# Patient Record
Sex: Female | Born: 1975
Health system: Southern US, Community
[De-identification: ages and names within clinical notes are randomized; demographics above are authoritative.]

## PROBLEM LIST (undated history)

## (undated) DIAGNOSIS — R112 Nausea with vomiting, unspecified: Secondary | ICD-10-CM

## (undated) DIAGNOSIS — D649 Anemia, unspecified: Secondary | ICD-10-CM

## (undated) DIAGNOSIS — D219 Benign neoplasm of connective and other soft tissue, unspecified: Secondary | ICD-10-CM

## (undated) DIAGNOSIS — Z9889 Other specified postprocedural states: Secondary | ICD-10-CM

## (undated) HISTORY — DX: Benign neoplasm of connective and other soft tissue, unspecified: D21.9

## (undated) HISTORY — PX: EYE SURGERY: SHX253

## (undated) HISTORY — DX: Anemia, unspecified: D64.9

## (undated) HISTORY — PX: IRRIGATION AND DEBRIDEMENT SEBACEOUS CYST: SHX5255

## (undated) HISTORY — PX: UTERINE FIBROID SURGERY: SHX826

---

## 1998-04-26 ENCOUNTER — Encounter: Payer: Self-pay | Admitting: Emergency Medicine

## 1998-04-26 ENCOUNTER — Emergency Department (HOSPITAL_COMMUNITY): Admission: EM | Admit: 1998-04-26 | Discharge: 1998-04-26 | Payer: Self-pay | Admitting: *Deleted

## 1999-01-04 ENCOUNTER — Emergency Department (HOSPITAL_COMMUNITY): Admission: EM | Admit: 1999-01-04 | Discharge: 1999-01-04 | Payer: Self-pay | Admitting: Emergency Medicine

## 1999-01-04 ENCOUNTER — Encounter: Payer: Self-pay | Admitting: Emergency Medicine

## 1999-02-10 ENCOUNTER — Encounter: Admission: RE | Admit: 1999-02-10 | Discharge: 1999-02-10 | Payer: Self-pay | Admitting: Family Medicine

## 1999-04-03 ENCOUNTER — Encounter: Admission: RE | Admit: 1999-04-03 | Discharge: 1999-04-03 | Payer: Self-pay | Admitting: Family Medicine

## 1999-05-13 ENCOUNTER — Encounter: Admission: RE | Admit: 1999-05-13 | Discharge: 1999-05-13 | Payer: Self-pay | Admitting: Family Medicine

## 2007-03-25 ENCOUNTER — Inpatient Hospital Stay (HOSPITAL_COMMUNITY): Admission: AD | Admit: 2007-03-25 | Discharge: 2007-03-27 | Payer: Self-pay | Admitting: Obstetrics and Gynecology

## 2007-07-03 ENCOUNTER — Inpatient Hospital Stay (HOSPITAL_COMMUNITY): Admission: AD | Admit: 2007-07-03 | Discharge: 2007-07-06 | Payer: Self-pay | Admitting: Obstetrics and Gynecology

## 2008-05-16 ENCOUNTER — Other Ambulatory Visit: Admission: RE | Admit: 2008-05-16 | Discharge: 2008-05-16 | Payer: Self-pay | Admitting: Family Medicine

## 2009-07-29 ENCOUNTER — Inpatient Hospital Stay (HOSPITAL_COMMUNITY): Admission: AD | Admit: 2009-07-29 | Discharge: 2009-08-02 | Payer: Self-pay | Admitting: Obstetrics and Gynecology

## 2009-07-30 ENCOUNTER — Encounter (INDEPENDENT_AMBULATORY_CARE_PROVIDER_SITE_OTHER): Payer: Self-pay | Admitting: Obstetrics and Gynecology

## 2010-11-18 LAB — CBC
HCT: 28.8 % — ABNORMAL LOW (ref 36.0–46.0)
HCT: 39.1 % (ref 36.0–46.0)
Hemoglobin: 12.6 g/dL (ref 12.0–15.0)
Hemoglobin: 9.4 g/dL — ABNORMAL LOW (ref 12.0–15.0)
MCHC: 32.3 g/dL (ref 30.0–36.0)
MCHC: 32.7 g/dL (ref 30.0–36.0)
MCV: 84.4 fL (ref 78.0–100.0)
MCV: 85.2 fL (ref 78.0–100.0)
Platelets: 175 10*3/uL (ref 150–400)
RBC: 3.38 MIL/uL — ABNORMAL LOW (ref 3.87–5.11)
RBC: 4.63 MIL/uL (ref 3.87–5.11)
RDW: 14.6 % (ref 11.5–15.5)
RDW: 14.8 % (ref 11.5–15.5)
WBC: 12 10*3/uL — ABNORMAL HIGH (ref 4.0–10.5)
WBC: 15.8 10*3/uL — ABNORMAL HIGH (ref 4.0–10.5)

## 2010-11-18 LAB — RPR: RPR Ser Ql: NONREACTIVE

## 2010-12-30 NOTE — Discharge Summary (Signed)
[name], [name]               ACCOUNT NO.:  [id number]   MEDICAL RECORD NO.:  [id number]          PATIENT TYPE:  INP   LOCATION:  9156                          [facility]ILITY:  [facility]   PHYSICIAN:  Hal Morales, M.D.DATE OF BIRTH:  02-28-1976   DATE OF ADMISSION:  03/25/2007  DATE OF DISCHARGE:  03/27/2007                               DISCHARGE SUMMARY   HISTORY OF PRESENT ILLNESS:  Patient is a 35 year old gravida 1, para 0  admitted at 27 weeks and 0 days gestation with sudden onset of  periumbilical pain on March 24, 2007.  Patient without fever, nausea,  vomiting, constipation, diarrhea but some decreased appetite.   Physical examination on admission revealed gravid abdomen with marked  tenderness at the umbilicus and positive bowel sounds.  Patient with  uterine irritability and uterine contractions on admission which  resolved with terbutaline.  Patient's initial labs on admission revealed  a white count of 13.9, otherwise negative CBC.  Due to the patient's  presentation at the time of admission, general surgery consult was  obtained.  At that time, it was felt that the patient had incarcerated  umbilical hernia and was scheduled for umbilical hernia repair.  Patient  underwent exploratory laparotomy on March 26, 2007, at which time no  umbilical hernia was noted, however, a 6 cm uterine fibroid was noted to  be present.  Patient tolerated the procedure without difficulty, without  significant blood loss.  On the day of discharge, patient was  ambulating, voiding and tolerating p.o. liquids and solids without  difficulty.  Abdominal incision clean, dry and intact with staples in  place.  No redness or drainage noted.  Positive bowel sounds noted and  patient with flatus as well as bowel movement prior to discharge.   During the patient's postoperative course, some uterine contractions  were noted, however, they resolved with one dose of Motrin 600 mg.  At  the time of  discharge, patient denies uterine contractions as well as  leakage of fluid, bleeding and reports that her fetus is moving  normally.  Fetal heart rate tracing has been reassuring throughout the  patient's admission.  It was felt that the patient was stable and was  discharged to home.   DISCHARGE MEDICATIONS:  1. Tylox one to two tablets q.4-6h. p.r.n. severe pain.  2. Tylenol one to two tablets q.4-6h. p.r.n. mild pain.  3. Prenatal vitamins.   DISCHARGE INSTRUCTIONS:  Patient will return to the office on Thursday,  March 31, 2007, for staple removal and postoperative follow-up.  Patient will also return to the office to schedule for return OB visit  on April 13, 2007.  Patient was instructed if she has fever, redness or  drainage from her incision, then she should notify the office  immediately as well as any nausea, vomiting or severe abdominal pain.  Wound care was also discussed with the patient.   DIAGNOSES:  1. Intrauterine pregnancy at 27-4/7 weeks.  2. Uterine fibroid.  3. Status post exploratory laparotomy.      Rhona Leavens, CNM      Erie Noe  Durenda Hurt, M.D.  Electronically Signed    NOS/MEDQ  D:  03/27/2007  T:  03/28/2007  Job:  [id number]

## 2010-12-30 NOTE — H&P (Signed)
[name], [name]               ACCOUNT NO.:  [id number]   MEDICAL RECORD NO.:  [id number]          PATIENT TYPE:  INP   LOCATION:  9128                          [facility]  [facility]   PHYSICIAN:  Naima A. Dillard, M.D. DATE OF BIRTH:  16-Mar-1976   DATE OF ADMISSION:  07/03/2007  DATE OF DISCHARGE:                              HISTORY & PHYSICAL   This is a 35 year old gravida 1, para 0 at 41-4/7 weeks who presents  with regular contractions for several hours.  She denies leaking or  bleeding and reports positive fetal movement.  Pregnancy has been  followed by the nurse midwife service and remarkable for  1. History of fibroids.  2. Group B strep negative.   ALLERGIES:  None.   OBSTETRICAL HISTORY:  The patient is primigravida.   MEDICAL HISTORY:  Remarkable for  1. Childhood varicella.  2. Past history of UTI.   SURGICAL HISTORY:  1. Remarkable for removal of back cyst.  2. Exploratory laparotomy during this pregnancy.   FAMILY HISTORY:  Remarkable for grandmother with hypertension and  uterine cancer.  Father with throat cancer.  Family history of  depression.   GENETIC HISTORY:  Negative.   SOCIAL HISTORY:  The patient is married to [employer], who is  involved and supportive.  She is of the Islam faith.  She denies any  alcohol, tobacco or drug use.   PRENATAL LABS:  Hemoglobin 13.1, platelets 221.  Blood type A+, antibody  screen negative, sickle cell negative, RPR nonreactive, rubella immune.  Hepatitis negative, HIV negative.  Cystic fibrosis declined.   HISTORY OF CURRENT PREGNANCY:  The patient entered care at [redacted] weeks  gestation.  She declined cystic fibrosis and first trimester screen and  quad screen.  Pap was normal.  She had an ultrasound at 18 weeks that  was normal.  She had some abdominal pain with an abdominal mass at 27  weeks.  It was initially thought to be an umbilical hernia.  She was  taken to surgery for suspected incarcerated hernia and it  was found to  be a fibroid.  She healed well from that.  She had a normal Glucola and  she was group B strep negative at term.   OBJECTIVE:  VITAL SIGNS:  Stable, afebrile.  HEENT:  Within normal limits.  NECK:  Thyroid normal, not enlarged.  CHEST:  Clear to auscultation.  HEART:  Regular rate and rhythm.  ABDOMEN:  Gravid, vertex [place], [place] shows reactive fetal heart rate  with contractions every 3 minutes.  Cervix is 5 cm, 90% effaced, -1 to -  2 station with vertex presentation.  EXTREMITIES:  Within normal limits.   ASSESSMENT:  1. Intrauterine pregnancy at 41-4/7 weeks.  2. Active labor.   PLAN:  1. Admit to birthing suites per Dr. Normand Sloop.  2. Routine CNM orders.  3. Desires noninterventive birth.      Marie L. Williams, C.N.M.      Naima A. Normand Sloop, M.D.  Electronically Signed    MLW/MEDQ  D:  07/03/2007  T:  07/04/2007  Job:  [id number]

## 2010-12-30 NOTE — H&P (Signed)
[name], [name]               ACCOUNT NO.:  [id number]   MEDICAL RECORD NO.:  [id number]          PATIENT TYPE:  INP   LOCATION:  9156                          [facility]   PHYSICIAN:  Hal Morales, M.D.DATE OF BIRTH:  1976-04-25   DATE OF ADMISSION:  03/25/2007  DATE OF DISCHARGE:                              HISTORY & PHYSICAL   HISTORY OF PRESENT ILLNESS:  The patient is a 35 year old multi-ethnic  married female, gravida 1, para 0, who has been followed at [facility] for this pregnancy.  Her last menstrual period began on  September 13, 2006, giving her an estimated date of confinement of  June 22, 2007.  This was confirmed by an 18-week ultrasound and she  is currently at 39 weeks' gestation.  The patient presented to [facility] on March 25, 2007, for a work-in visit complaining of a  24-hour history of abdominal pain radiating under the umbilicus.  The  area is exquisitely tender to touch.  The area of pain is sometimes  directly over the umbilicus and sometimes radially it appears as much as  5 cm circumferentially around the umbilicus.  The pain is intermittent.  The patient has also noticed a lump in the umbilicus which is  sometimes present but then at other times has resolved with no  protrusion of the umbilicus.  She specifically denied nausea or  vomiting, constipation or diarrhea.  She has been able to continue  eating but has noticed some slight decrease in her appetite.  She denies  any fever or chills.  She has never had similar pain in the past.  She  continues to note regular fetal movement.  At the time of her evaluation  at [facility] she underwent a fetal fibronectin test, which was  subsequently noted as negative.  She also had cultures for gonorrhea,  chlamydia and group B strep, all of which are pending.  Her cervix was  long and closed.  She was sent to maternity admissions for further  evaluation.  On  admission to maternity admissions she continued to  complain of umbilical pain and was noted on the monitor to have some  uterine irritability and contractions.  She was given a single dose of  terbutaline, which allowed the contractions to resolve; however, she did  not have resolution of her pain and the pain subsequently escalated  again to a 7/10.   PAST OBSTETRICAL HISTORY:  This is the patient's first pregnancy.   PAST GYNECOLOGIC HISTORY:  Regular menses every 28-29 days, lasting 5-6  days, with no significant cramping or heavy menses.  Last menstrual  period was September 13, 2006.  The patient has never had abnormalities on  her routine gynecologic evaluations.   MEDICAL HISTORY:  The patient specifically denies any hypertension or  diabetes.  She did start a hepatitis series but did not have any further  injections of the hepatitis B prophylactic injection after the first  injection.  The remainder of her vaccination history is that her MMR is  current and she had chicken pox  as a child.  The patient has had  cystitis in the past.   SURGICAL HISTORY:  The patient had a cystic mass removed from her back  at age 62, thought to be a probable lipoma.   FAMILY HISTORY:  Positive for chronic hypertension in paternal  grandmother, uterine cancer in paternal grandmother, throat cancer in  her father.  There is also a history of domestic violence in the family  on the maternal side.  There is a history of alcoholism and drug use in  the family history.   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  She specifically denies alcohol or drug use.  She is married to  [employer].  They are of the Islamic faith.   REVIEW OF SYSTEMS:  Negative except as mentioned above.  Specifically,  the patient denies any dysuria though she has noticed a pulling  sensation at the umbilicus when she has urinated.   PHYSICAL EXAMINATION:  The patient is  a gravid multi-ethnic female in no  acute distress but mild abdominal distress on palpation.  The temperature is 97.9, pulse 83, respirations 20, blood pressure 106-  110/61-80.  LUNGS:  Clear.  HEART:  Regular rate and rhythm.  ABDOMEN:  Gravid with marked tenderness at the umbilicus.  The area  surrounding the umbilicus for approximately 5 cm is likewise tender and  a full examination is quite difficult.  Bowel sounds are normal.  EXTREMITIES:  No clubbing, cyanosis, or edema.  PELVIC:  Done at [facility] on the same day of admission  showing external genitalia within normal limits.  The vagina was rugous.  The cervix was without gross lesions.  The cervix was long and closed.  The fetal heart rate evaluation showed a baseline of 140 beats per  minute with good beat-to-beat variability and good reactivity, no  decelerations.  Contractions had originally been approximately every 2  minutes on admission, lasting about 20 seconds and mild intensity.  These decreased significantly with a single dose of terbutaline.   Laboratory studies showed a hemoglobin of 12, white blood cell count of  13.9, 87% neutrophils, 7% lymphocytes.   IMPRESSION:  1. Probable incarcerated hernia but without significant      gastrointestinal symptomatology.  2. Limited opportunity for abdominal examination.   DISPOSITION:  1. A discussion was held with Bertha Stakes, M.D., of radiology,      who felt that the ability to determine incarcerated hernia was      limited by whether or not peristalsis was occurring.  2. A general surgical consultation was requested.  A conversation with      Dr. Ezzard Standing reveals that his decision for exploration would be based      upon his examination, not upon radiologic studies, and therefore it      probably was not warranted to expose the patient to any significant      radiation and not warranted to proceed with other radiologic      studies.  His plan is  to see the patient later this evening and      make a decision about the recommendation for surgical intervention.  3. The patient will have analgesia as needed for comfort.  4. Since the fetal heart rate is quite reassuring it will be monitored      intermittently, but continuous monitoring of contraction activity      will occur with reevaluation if the  patient has more than 8      contractions per hour.      Hal Morales, M.D.  Electronically Signed     VPH/MEDQ  D:  03/26/2007  T:  03/26/2007  Job:  [id number]

## 2010-12-30 NOTE — Consult Note (Signed)
NAMEBRIN, RUGGERIO               ACCOUNT NO.:  [id number]   MEDICAL RECORD NO.:  [id number]          PATIENT TYPE:  INP   LOCATION:  9156                          [facility]ILITY:  [facility]   PHYSICIAN:  Sandria Bales. Ezzard Standing, M.D.  DATE OF BIRTH:  1976-08-06   DATE OF CONSULTATION:  DATE OF DISCHARGE:                                 CONSULTATION   REASON FOR CONSULTATION:  Umbilical hernia.   HISTORY OF ILLNESS:  This is a 35 year old female who is a patient of  Hal Morales, M.D. who is in her first pregnancy with a due date of  June 22, 2007.  So, she is approximately 26-[redacted] weeks pregnant.  She  has had over the last 3 weeks an increasing infraumbilical mass which  has become increasingly tender.  She was admitted by Dr. Pennie Rushing this  evening for this tender mass.  She has had no prior history of peptic  ulcer disease, liver disease, gallbladder disease, pancreatic disease,  or colon disease.  She has had no prior abdominal surgery.   PAST MEDICAL HISTORY:  1. She has no allergies.  2. She is on no medications, except for her perinatal vitamins,      etcetera.   REVIEW OF SYSTEMS:  Negative for significant pulmonary, cardiac,  gastrointestinal, or urologic problems.   She works as a Child psychotherapist.  Her husband is in the room with her.   PHYSICAL EXAMINATION:  VITAL SIGNS:  Her pulse is 68.  Her blood  pressure is 98/57.  Her temperature is 97.5.  Her fetal monitor shows a  fetal rate of 135-140.  HEENT:  Unremarkable.  NECK:  Supple.  I feel no mass, no thyromegaly.  LUNGS:  Clear to auscultation.  HEART:  Regular rate and rhythm without murmur or rub.  BREASTS:  I did not examine her breasts.  ABDOMEN:  She has a uterus which is probably about 3-cm above her  umbilicus consistent with her current pregnancy.  She also has about a 4-  to-5-cm tender mass immediately below her umbilicus.  I am not able to  reduce this mass.  And, she is very tender to palpation.  It feels  somewhat unusual for an umbilical hernia.  EXTREMITIES:  Show good strength in all 4 extremities.  NEUROLOGIC:  Grossly intact.   IMPRESSION:  1. She has a possible incarcerated umbilical/ventral hernia.  It does      not feel like a typical umbilical hernia and I have never seen an      incarcerated umbilical hernia this far along in a pregnancy.  As      tender as she is,  I do not see how this can wait till the end of      her pregnancy and I think she would be best served by going ahead      and having this explored at this time.  I discussed with her and her husband the risks of surgery which include  bleeding, infection, recurrence of the hernia, some type of bowel  injury.  I doubt  this is related to  the uterus or pregnancy but there is a  chance.  There is also a chance of inducing labor.  I  think they understand both that risk and benefit.  I also spoke to Dr.  Pennie Rushing over this.  Dr. Pennie Rushing expressed interest at being in the  operating room during the surgery.  I think it would be a good idea.  1. Intrauterine pregnancy, approximately 27 weeks.      Sandria Bales. Ezzard Standing, M.D.  Electronically Signed     DHN/MEDQ  D:  03/25/2007  T:  03/25/2007  Job:  [id number]   cc:   Hal Morales, M.D.  Fax: [phone]

## 2010-12-30 NOTE — Op Note (Signed)
[name], [name]               ACCOUNT NO.:  [id number]   MEDICAL RECORD NO.:  [id number]          PATIENT TYPE:  INP   LOCATION:  9156                          [facility]   PHYSICIAN:  Sandria Bales. Ezzard Standing, M.D.  DATE OF BIRTH:  Feb 05, 1976   DATE OF PROCEDURE:  03/26/2007  DATE OF DISCHARGE:                               OPERATIVE REPORT   PREOPERATIVE DIAGNOSIS:  Incarcerated umbilical hernia.   POSTOPERATIVE DIAGNOSIS:  A 6-cm uterine fibroid on top of [redacted] week  pregnant uterus, no evidence of fascial defect or umbilical hernia.   PROCEDURE:  Abdominal exploration.   SURGEON:  Sandria Bales. Ezzard Standing, M.D.   FIRST ASSISTANT:  Vanessa P. Pennie Rushing, M.D.   ANESTHESIA:  Spinal with 20 cc of 0.25% Marcaine.   COMPLICATIONS:  None.   INDICATIONS FOR PROCEDURE:  The patient is a 35 year old female who is  approximately [redacted] weeks pregnant who presented with an increasing tender  infraumbilical mass over the last 2-3 weeks.  She was admitted yesterday  by Dr. Pennie Rushing to [facility] and on examination she has a broad  based mass immediately below her umbilicus.  She is so tender it is very  hard to get a good idea of this mass.  My concern was that she had a  possible incarcerated umbilical hernia.  We discussed the options of  continued observation for the duration of her pregnancy versus exploring  this area and she agreed to explore this area.   The indications and potential complications of surgery were explained to  the patient.  Potential complications include but are not limited to  bleeding, infection, bowel injury, possibility of some kind of resection  and that this could be something other than an umbilical hernia.   OPERATIVE NOTE:  The patient had a spinal anesthetic placed by Dr. Dartha Lodge.  She was given 1 gram of Ancef at initiation of procedure.  Abdomen was prepped with Betadine solution and sterilely draped.  She  had a Foley catheter in place.  A time out  was held identifying patient  and procedure.   Dr. Pennie Rushing scrubbed in during the procedure.  I made a vertical  incision around the umbilicus and cut down through the skin but this  lady is actually much thinner than I actually could appreciate with her  pregnancy and actually got into the fascia and saw no fascial defect but  I could feel this mass which actually was somewhat mobile immediately  under the fascia.  I made an incision in the fascia, approximately 5 cm  in length, and what she had was a uterine fibroid which was protruding  from the anterior surface of her uterus.   Dr. Pennie Rushing was there, discussion actually carried out with the patient  since she was alert about whether this ought to be resected or left  alone.  The decision with Dr. Pennie Rushing and patient was to leave this  alone.  There was no bowel, no intestines, no omentum stuck to this at  all.   I then closed the abdomen with two running #1  PDS sutures and stapled  the skin.  I then infiltrated the fascia with 0.25% Marcaine, about 20  cc.   The patient tolerated the procedure well.  Will be kept overnight.  If  she does well, will be discharged home tomorrow.  I will leave her  discharge plans and follow up with Dr. Pennie Rushing regarding diet, staple  removal, and her appointment to follow up with me, but I will try to see  her tomorrow on rounds.      Sandria Bales. Ezzard Standing, M.D.  Electronically Signed     DHN/MEDQ  D:  03/26/2007  T:  03/27/2007  Job:  [id number]   cc:   Hal Morales, M.D.  Fax: [phone]

## 2011-05-26 LAB — CBC
HCT: 28.3 — ABNORMAL LOW
MCHC: 33
MCV: 78.8
Platelets: 160
RBC: 4.88
RDW: 16.2 — ABNORMAL HIGH
RDW: 16.7 — ABNORMAL HIGH

## 2011-05-26 LAB — RPR: RPR Ser Ql: NONREACTIVE

## 2011-06-01 LAB — DIFFERENTIAL
Basophils Absolute: 0
Basophils Relative: 0
Eosinophils Absolute: 0.1
Eosinophils Relative: 1
Lymphocytes Relative: 7 — ABNORMAL LOW
Lymphs Abs: 1
Monocytes Absolute: 0.8 — ABNORMAL HIGH
Monocytes Relative: 6
Neutro Abs: 12.1 — ABNORMAL HIGH
Neutrophils Relative %: 87 — ABNORMAL HIGH

## 2011-06-01 LAB — CBC
Hemoglobin: 12
Platelets: 183
RDW: 14
WBC: 13.9 — ABNORMAL HIGH

## 2011-12-31 ENCOUNTER — Ambulatory Visit (INDEPENDENT_AMBULATORY_CARE_PROVIDER_SITE_OTHER): Payer: BC Managed Care – PPO | Admitting: Internal Medicine

## 2011-12-31 VITALS — BP 112/73 | HR 78 | Temp 98.9°F | Resp 16 | Ht 65.5 in | Wt 169.0 lb

## 2011-12-31 DIAGNOSIS — R05 Cough: Secondary | ICD-10-CM

## 2011-12-31 DIAGNOSIS — J029 Acute pharyngitis, unspecified: Secondary | ICD-10-CM

## 2011-12-31 DIAGNOSIS — R6883 Chills (without fever): Secondary | ICD-10-CM

## 2011-12-31 DIAGNOSIS — J40 Bronchitis, not specified as acute or chronic: Secondary | ICD-10-CM

## 2011-12-31 MED ORDER — HYDROCODONE-ACETAMINOPHEN 7.5-500 MG/15ML PO SOLN: 5.0000 mL | Freq: Four times a day (QID) | ORAL | Status: AC | PRN

## 2011-12-31 MED ORDER — AZITHROMYCIN 500 MG PO TABS: 500.0000 mg | ORAL_TABLET | Freq: Every day | ORAL | Status: AC

## 2011-12-31 NOTE — Progress Notes (Signed)
  Subjective:    Patient ID: [name], female    DOB: 09/17/1975, 36 y.o.   MRN: [id number]  HPI Has cough , chills, and st sudden on set. Her child is sick with same Sputum green.   Review of Systems     Objective:   Physical Exam Alert, NAD Throat red Ears clear Lungs coarse bs      Assessment & Plan:  Bronchitis

## 2011-12-31 NOTE — Patient Instructions (Signed)

## 2012-06-13 ENCOUNTER — Other Ambulatory Visit: Payer: Self-pay | Admitting: Obstetrics and Gynecology

## 2012-06-13 ENCOUNTER — Encounter: Payer: Self-pay | Admitting: Obstetrics and Gynecology

## 2012-06-13 ENCOUNTER — Ambulatory Visit (INDEPENDENT_AMBULATORY_CARE_PROVIDER_SITE_OTHER): Payer: BC Managed Care – PPO | Admitting: Obstetrics and Gynecology

## 2012-06-13 ENCOUNTER — Ambulatory Visit (INDEPENDENT_AMBULATORY_CARE_PROVIDER_SITE_OTHER): Payer: BC Managed Care – PPO

## 2012-06-13 ENCOUNTER — Telehealth: Payer: Self-pay | Admitting: Obstetrics and Gynecology

## 2012-06-13 VITALS — BP 102/62 | Wt 176.0 lb

## 2012-06-13 DIAGNOSIS — O200 Threatened abortion: Secondary | ICD-10-CM

## 2012-06-13 DIAGNOSIS — O209 Hemorrhage in early pregnancy, unspecified: Secondary | ICD-10-CM

## 2012-06-13 LAB — CBC WITH DIFFERENTIAL/PLATELET
Basophils Relative: 0 % (ref 0–1)
Eosinophils Absolute: 0.4 10*3/uL (ref 0.0–0.7)
Eosinophils Relative: 4 % (ref 0–5)
HCT: 38.2 % (ref 36.0–46.0)
Hemoglobin: 12.5 g/dL (ref 12.0–15.0)
MCH: 26.3 pg (ref 26.0–34.0)
MCHC: 32.7 g/dL (ref 30.0–36.0)
MCV: 80.3 fL (ref 78.0–100.0)
Monocytes Absolute: 0.9 10*3/uL (ref 0.1–1.0)
Monocytes Relative: 9 % (ref 3–12)

## 2012-06-13 NOTE — Progress Notes (Signed)
F/u u/s for 1st trimester bleeding.  BP 102/62  Wt 176 lb (79.833 kg)  LMP 04/06/2012 Pt reports she still has some spotting She declined a pelvic exam [place] sig for 2 yolk sacs and size less than dates.  Normal adnexa Threatened abortion ? Twin pregnancy Pt given bleeding precautions.  Her blood type is A pos Repeat US in 2 weeks for f/u viability

## 2012-06-13 NOTE — Telephone Encounter (Signed)
VM from pt. States LMP 04/06/12.  Had some bleeding 06/11/12.  Since 06/12/12 has been bleeding and cramping like a normal menses. Has NOB interview scheduled today.  Per DR ND,interview cancelled.  Scheduled for U/S and F/U today.

## 2012-06-14 ENCOUNTER — Other Ambulatory Visit: Payer: Self-pay | Admitting: Obstetrics and Gynecology

## 2012-06-14 ENCOUNTER — Inpatient Hospital Stay (HOSPITAL_COMMUNITY)
Admission: AD | Admit: 2012-06-14 | Discharge: 2012-06-14 | Disposition: A | Discharge status: Home / Self Care | Payer: BC Managed Care – PPO | Source: Ambulatory Visit | Attending: Obstetrics and Gynecology | Admitting: Obstetrics and Gynecology

## 2012-06-14 ENCOUNTER — Telehealth: Payer: Self-pay | Admitting: Obstetrics and Gynecology

## 2012-06-14 ENCOUNTER — Inpatient Hospital Stay (HOSPITAL_COMMUNITY): Payer: BC Managed Care – PPO

## 2012-06-14 ENCOUNTER — Encounter (HOSPITAL_COMMUNITY): Payer: Self-pay | Admitting: Family

## 2012-06-14 DIAGNOSIS — O034 Incomplete spontaneous abortion without complication: Secondary | ICD-10-CM

## 2012-06-14 DIAGNOSIS — O200 Threatened abortion: Secondary | ICD-10-CM | POA: Insufficient documentation

## 2012-06-14 LAB — CBC
MCH: 26.6 pg (ref 26.0–34.0)
MCV: 80.1 fL (ref 78.0–100.0)
Platelets: 245 10*3/uL (ref 150–400)
RBC: 4.62 MIL/uL (ref 3.87–5.11)
RDW: 15.2 % (ref 11.5–15.5)

## 2012-06-14 NOTE — Telephone Encounter (Signed)
TC from pt. States had cramping and back pain last PM.  Today used 2 pads, but within last hour felt gush and has filled a pad. Per SL, pt to MAU.

## 2012-06-14 NOTE — MAU Provider Note (Signed)
Pt is a 36yo G3P2 at [redacted]w[redacted]d by LMP Seen in office yesterday for evaluation of bleeding and US showed ? 2 yolk sacs and S<D Pt was instructed to CTO and f/u if bleeding/pain  Increased  She arrived to MAU after calling office to report increased bleeding Upon arrival, pt passed a 3cm clot ?tissue States she has little to no pain after that  [place] today revealed no IUP, no GS, endometrial debris, sm corpus luteum on L side, otherwise normal Results for orders placed during the hospital encounter of 06/14/12 (from the past 24 hour(s))  CBC     Status: Abnormal   Collection Time   06/14/12  4:35 PM      Component Value Range   WBC 10.6 (*) 4.0 - 10.5 K/uL   RBC 4.62  3.87 - 5.11 MIL/uL   Hemoglobin 12.3  12.0 - 15.0 g/dL   HCT 21.3  08.6 - 57.8 %   MCV 80.1  78.0 - 100.0 fL   MCH 26.6  26.0 - 34.0 pg   MCHC 33.2  30.0 - 36.0 g/dL   RDW 46.9  62.9 - 52.8 %   Platelets 245  150 - 400 K/uL  HCG, QUANTITATIVE, PREGNANCY     Status: Abnormal   Collection Time   06/14/12  4:35 PM      Component Value Range   hCG, Beta Chain, Quant, S 10846 (*) <5 mIU/mL     ROS - neg   PE A&O no distress Heart RRR Lungs CTAB abd soft, NT,  Pelvic declined per pt, scant bleeding on pad Ext - WNL  A: SAB in progress  CBC stable VSS  P: rv'd SAB, bleeding precautions Pt to f/u office 1-2wks for repeat quant D/w Dr Pennie Rushing

## 2012-06-15 ENCOUNTER — Other Ambulatory Visit: Payer: Self-pay | Admitting: Obstetrics and Gynecology

## 2012-06-15 ENCOUNTER — Telehealth: Payer: Self-pay | Admitting: Obstetrics and Gynecology

## 2012-06-15 DIAGNOSIS — O039 Complete or unspecified spontaneous abortion without complication: Secondary | ICD-10-CM

## 2012-06-15 LAB — US OB TRANSVAGINAL

## 2012-06-15 LAB — US OB COMP LESS 14 WKS

## 2012-06-15 NOTE — Telephone Encounter (Signed)
Message copied by Mason Jim on Wed Jun 15, 2012 10:41 AM ------      Message from: Malissa Hippo.      Created: Tue Jun 14, 2012 10:41 PM      Regarding: pt needs quant and f/u SAB        Pt 9wks by LMP, yolk sac only on [place] yesterday      Today in MAU no IUP for gestational sac      Bleeding stable      Quant 10, 000            Pt needs repeat quant in 2wks      And office f/u 4-6 wks (w provider of her choice)            Thanks!      SL

## 2012-06-15 NOTE — Telephone Encounter (Signed)
Tc TO pt.  Per SL scheduled repeat [facility] 06/28/12 and F/U appt with DR ND 07/19/12.  States cramping has decreased and is feeling better.  To call with any concerns.  Sympathy given.

## 2012-06-15 NOTE — Telephone Encounter (Signed)
Message copied by Mason Jim on Wed Jun 15, 2012 10:49 AM ------      Message from: Malissa Hippo.      Created: Tue Jun 14, 2012 10:41 PM      Regarding: pt needs quant and f/u SAB        Pt 9wks by LMP, yolk sac only on [place] yesterday      Today in MAU no IUP for gestational sac      Bleeding stable      Quant 10, 000            Pt needs repeat quant in 2wks      And office f/u 4-6 wks (w provider of her choice)            Thanks!      SL

## 2012-06-16 ENCOUNTER — Telehealth: Payer: Self-pay

## 2012-06-16 ENCOUNTER — Other Ambulatory Visit: Payer: BC Managed Care – PPO

## 2012-06-16 DIAGNOSIS — O209 Hemorrhage in early pregnancy, unspecified: Secondary | ICD-10-CM

## 2012-06-16 NOTE — Telephone Encounter (Signed)
Pc to [facility] per verbal path report from Dr. Frederica Kuster. Pt to have a QHCG today. If < 10,000, pt to keep fu appts sched 06/28/12 for [facility] and fu with ND on 07/19/12. If QHCG not <10,000, on call provider is to be made aware and decide POC. Lm on pt's vm to cb to sched [facility] today.

## 2012-06-16 NOTE — Telephone Encounter (Signed)
Pc from Dr. Frederica Kuster. Wanted to make VPH aware that there is no known evidence of products of conception and no villi seen from specimen. Will make VPH aware.

## 2012-06-16 NOTE — Telephone Encounter (Signed)
Tc from pt. Appt sched 06/16/12 @ 4:00p with lab only. Pt agrees.

## 2012-06-17 ENCOUNTER — Encounter: Payer: Self-pay | Admitting: Obstetrics and Gynecology

## 2012-06-17 ENCOUNTER — Ambulatory Visit (INDEPENDENT_AMBULATORY_CARE_PROVIDER_SITE_OTHER): Payer: BC Managed Care – PPO | Admitting: Obstetrics and Gynecology

## 2012-06-17 ENCOUNTER — Telehealth: Payer: Self-pay | Admitting: Obstetrics and Gynecology

## 2012-06-17 VITALS — BP 124/78 | Temp 98.9°F | Wt 176.0 lb

## 2012-06-17 DIAGNOSIS — O039 Complete or unspecified spontaneous abortion without complication: Secondary | ICD-10-CM

## 2012-06-17 NOTE — Telephone Encounter (Signed)
TC from pt.   States feels as though something is protruding from vagina. In process of SAB. Still having bleeding. +odor.  No pain, only dull burning.  Sched for eval with EP today.

## 2012-06-17 NOTE — Telephone Encounter (Signed)
QHCG=4718 on 06/16/12.

## 2012-06-17 NOTE — Progress Notes (Signed)
36 YO patient with previous QHCG (06/14/12) = 10,846 was seen for menstrual-like bleeding since 06/12/12.  A pelvic U/S on 06/13/12 showed an early twin gestation but no fetal pole.  A follow up ultrasound on 06/15/12 showed no signs of pregnancy. Patient here today after noticing something in vagina last night.  She looked due to her bleeding showing signs of increase (had diminished to a pad every 6 + hours) requiring her to need to change her pad sooner than expected.  So far today has only changed pad twice and feels lots of pelvic pressure.  O: Pelvic: EGBUS-visible ? POC at vaginal os-removed with ring forcep, vagina-moderate blood with ? POC protruding from cervical os-removed with forcep  A: Miscarriage  P; Specimens- ? POC sent to pathology      Patient states that she is emotionally accepting of the miscarriage process     To repeat QHCG in 1 week     Call with any concerns  Demetra Moya, PA-C

## 2012-06-22 ENCOUNTER — Encounter: Payer: Self-pay | Admitting: Obstetrics and Gynecology

## 2012-06-22 ENCOUNTER — Other Ambulatory Visit: Payer: BC Managed Care – PPO

## 2012-06-22 DIAGNOSIS — O030 Genital tract and pelvic infection following incomplete spontaneous abortion: Secondary | ICD-10-CM

## 2012-06-22 LAB — HCG, QUANTITATIVE, PREGNANCY: hCG, Beta Chain, Quant, S: 698.7 m[IU]/mL

## 2012-06-23 ENCOUNTER — Telehealth: Payer: Self-pay

## 2012-06-23 NOTE — Telephone Encounter (Signed)
SPOKE WITH PT REGARDING HER QHCG LEVEL AND ADVISED PT THAT SHE NEED TO DO WEEKLY BLOOD TEST UNTIL LEVELS ARE NEGATIVE. PT  IS SCHEDULED NEXT TUES.ADVISED PT TO USE CONDOMS UNTIL LEVELS ARE NORMAL . PT VOICED UNDERSTANDING.

## 2012-06-23 NOTE — Telephone Encounter (Signed)
Message copied by Winfred Leeds on Thu Jun 23, 2012  8:52 AM ------      Message from: Henreitta Leber      Created: Thu Jun 23, 2012  7:26 AM       Notify patient of [facility] level and need to repeat weekly until values are negative.  Advise to use condoms until levels return negative.  Thank you.  EP

## 2012-06-23 NOTE — Telephone Encounter (Signed)
TC TO PT REGARDING PATHOLOGY REPORT. INFORMED PT THAT THE REPORT SHOWS PRODUCT OF CONCEPTION. PT STATES THAT SHE IS STILL BLEEDING AND WANTED TO KNOW IF THAT WAS NORMAL. INFORMED PT THAT IT IS NORMAL BUT, IF SHE STARTS TO SOAK A PAD AN HOUR AND IF HER CRAMPING BECOME SEVERE, TO GIVE [place] A CALL BACK. PT VOICED UNDERSTANDING.

## 2012-06-23 NOTE — Telephone Encounter (Signed)
Message copied by Winfred Leeds on Thu Jun 23, 2012  8:48 AM ------      Message from: Henreitta Leber      Created: Thu Jun 23, 2012  7:29 AM       Notify patient that the results of her specimen were products of conception.  Thanks,  EP

## 2012-06-28 ENCOUNTER — Other Ambulatory Visit: Payer: BC Managed Care – PPO

## 2012-06-28 DIAGNOSIS — O030 Genital tract and pelvic infection following incomplete spontaneous abortion: Secondary | ICD-10-CM

## 2012-06-30 ENCOUNTER — Other Ambulatory Visit: Payer: Self-pay

## 2012-06-30 ENCOUNTER — Telehealth: Payer: Self-pay

## 2012-06-30 DIAGNOSIS — O039 Complete or unspecified spontaneous abortion without complication: Secondary | ICD-10-CM

## 2012-06-30 NOTE — Telephone Encounter (Signed)
Message copied by Winfred Leeds on Thu Jun 30, 2012  3:00 PM ------      Message from: Henreitta Leber      Created: Wed Jun 29, 2012  8:42 AM       Please notify patient of QHCG level and arrange for a repeat in 10 days.  Advise patient to use condoms until her QHCG levels are negative.  Thank you.  EP

## 2012-06-30 NOTE — Telephone Encounter (Signed)
TC TO PT REGARDING QUANT RESULTS. WENT OVER RESULTS AND SCHEDULE APPT FOR PT TO COME BACK ON 07-08-12 FOR REPEAT QUANT. ADVISED PT TO USE CONDOMS UNTIL LEVELS ARE NORMAL. PT VOICED UNDERSTANDING.

## 2012-07-08 ENCOUNTER — Other Ambulatory Visit: Payer: BC Managed Care – PPO

## 2012-07-08 DIAGNOSIS — O039 Complete or unspecified spontaneous abortion without complication: Secondary | ICD-10-CM

## 2012-07-11 ENCOUNTER — Telehealth: Payer: Self-pay

## 2012-07-11 NOTE — Telephone Encounter (Signed)
Message copied by Winfred Leeds on Mon Jul 11, 2012  1:54 PM ------      Message from: Henreitta Leber      Created: Mon Jul 11, 2012 12:13 PM       Patient will need to repeat her [facility] in 10 days thank you.  EP

## 2012-07-11 NOTE — Telephone Encounter (Signed)
Tc to pt regarding quant results. Informed pt that we need to repeat quant test in 10 days and pt states that she have an appt with ND on 07-19-12; could she do the test then? Informed pt that I will check to see if it is okay and if not ,we will schedule a day when she come for appt with ND. Pt voiced understanding.

## 2012-07-12 ENCOUNTER — Encounter: Payer: Self-pay | Admitting: Obstetrics and Gynecology

## 2012-07-19 ENCOUNTER — Encounter: Payer: Self-pay | Admitting: Obstetrics and Gynecology

## 2012-07-19 ENCOUNTER — Ambulatory Visit (INDEPENDENT_AMBULATORY_CARE_PROVIDER_SITE_OTHER): Payer: BC Managed Care – PPO | Admitting: Obstetrics and Gynecology

## 2012-07-19 VITALS — BP 110/60 | Ht 66.0 in | Wt 177.0 lb

## 2012-07-19 DIAGNOSIS — O039 Complete or unspecified spontaneous abortion without complication: Secondary | ICD-10-CM

## 2012-07-19 NOTE — Progress Notes (Signed)
Pt her to f/u SAB.  Pt still with some bleeding.   She is emotionally stable BP 110/60  Ht 5\' 6"  (1.676 m)  Wt 177 lb (80.287 kg)  BMI 28.57 kg/m2  LMP 04/06/2012 Physical Examination: General appearance - alert, well appearing, and in no distress Chest - clear to auscultation, no wheezes, rales or rhonchi, symmetric air entry Heart - normal rate and regular rhythm Abdomen - soft, nontender, nondistended, no masses or organomegaly Pelvic - normal external genitalia, vulva, vagina, cervix, uterus and adnexa.  Scant bleeding noted   Report     Comments:    FINAL DIAGNOSIS: A,B.  Products of Conception:        Degenerated chorionic villi and decidua. Daleen Bo, MD, FCAP Electronically Signed CLINICAL HISTORY: 634.90 SOURCE OF SPECIMEN: A: Products of Conception B: Products of Conception GROSS DESCRIPTION: There are two containers. A. Received in a single container of formalin labeled with the patient's name and "POC" is a 3.5 x 2.5 x 1.5 cm aggregate of multiple tan-brown, irregular, friable soft tissue fragments admixed with a moderate amount of tan-red, gelatinous blood clot.  No definitive chorionic villi or fetal tissue is grossly identified.  The specimen is submitted entirely in cassettes A1-A4.  AMK/srt  B. Received in a single container of formalin labeled with the patient's name and "POC" is a 3.2 x 2.7 x 2.2 cm aggregate of multiple tan-brown, irregular, partially friable soft tissue fragments admixed with a moderate amount of tan-red, gelatinous blood clot.  No definitive chorionic villi or fetal tissue is grossly identified.  The specimen is submitted entirely in cassettes B1-B4. AMK/srt     Resulting Agency [facility]  Result Narrative   SAB POC seen Check BHCG quant until less than 5

## 2012-07-20 LAB — HCG, QUANTITATIVE, PREGNANCY: hCG, Beta Chain, Quant, S: 8.7 m[IU]/mL

## 2012-10-01 ENCOUNTER — Other Ambulatory Visit: Payer: Self-pay

## 2013-06-22 ENCOUNTER — Other Ambulatory Visit: Payer: Self-pay

## 2013-08-09 LAB — OB RESULTS CONSOLE ABO/RH: RH Type: POSITIVE

## 2013-08-09 LAB — OB RESULTS CONSOLE ANTIBODY SCREEN: ANTIBODY SCREEN: NEGATIVE

## 2013-08-09 LAB — OB RESULTS CONSOLE HEPATITIS B SURFACE ANTIGEN: Hepatitis B Surface Ag: NEGATIVE

## 2013-08-09 LAB — OB RESULTS CONSOLE GC/CHLAMYDIA
CHLAMYDIA, DNA PROBE: NEGATIVE
GC PROBE AMP, GENITAL: NEGATIVE

## 2013-08-09 LAB — OB RESULTS CONSOLE HIV ANTIBODY (ROUTINE TESTING): HIV: NONREACTIVE

## 2013-08-09 LAB — OB RESULTS CONSOLE RPR: RPR: NONREACTIVE

## 2013-08-09 LAB — OB RESULTS CONSOLE RUBELLA ANTIBODY, IGM: RUBELLA: IMMUNE

## 2013-08-17 NOTE — L&D Delivery Note (Signed)
Operative Delivery Note At 5:20 AM a viable female was delivered via VBAC, Vacuum Assisted.  Presentation: vertex; Position: Right,, Occiput,, Anterior; Station: +3.  Verbal consent: obtained from patient.  Risks and benefits discussed in detail.  Risks include, but are not limited to the risks of anesthesia, bleeding, infection, damage to maternal tissues, fetal cephalhematoma.  There is also the risk of inability to effect vaginal delivery of the head, or shoulder dystocia that cannot be resolved by established maneuvers, leading to the need for emergency cesarean section.  Kiwi vacuum was placed without difficulty, 3 pulls with one pop off.  Delivery without complications.  APGAR: 9, 9; weight 8 lb 3.9 oz (3740 g).   Placenta status: Intact, Spontaneous.   Cord: 3 vessels with the following complications: None.  Cord pH: pending  Anesthesia: Epidural  Episiotomy: None Lacerations: Labial, Perineal Suture Repair: 3.0 vicryl Est. Blood Loss (mL): 300  Mom to postpartum.  Baby to Couplet care / Skin to Skin.  Janyth Pupa, M 03/11/2014, 7:28 AM

## 2014-01-01 ENCOUNTER — Encounter: Payer: Self-pay | Admitting: *Deleted

## 2014-02-08 LAB — OB RESULTS CONSOLE GBS: GBS: POSITIVE

## 2014-03-10 ENCOUNTER — Encounter (HOSPITAL_COMMUNITY): Payer: Self-pay

## 2014-03-10 ENCOUNTER — Encounter (HOSPITAL_COMMUNITY): Payer: BC Managed Care – PPO | Admitting: Anesthesiology

## 2014-03-10 ENCOUNTER — Inpatient Hospital Stay (HOSPITAL_COMMUNITY)
Admission: AD | Admit: 2014-03-10 | Discharge: 2014-03-13 | DRG: 775 | Disposition: A | Discharge status: Home / Self Care | Payer: BC Managed Care – PPO | Source: Ambulatory Visit | Attending: Obstetrics & Gynecology | Admitting: Obstetrics & Gynecology

## 2014-03-10 ENCOUNTER — Inpatient Hospital Stay (HOSPITAL_COMMUNITY): Payer: BC Managed Care – PPO | Admitting: Anesthesiology

## 2014-03-10 DIAGNOSIS — O9989 Other specified diseases and conditions complicating pregnancy, childbirth and the puerperium: Secondary | ICD-10-CM

## 2014-03-10 DIAGNOSIS — D649 Anemia, unspecified: Secondary | ICD-10-CM | POA: Diagnosis present

## 2014-03-10 DIAGNOSIS — O9902 Anemia complicating childbirth: Secondary | ICD-10-CM | POA: Diagnosis present

## 2014-03-10 DIAGNOSIS — O34599 Maternal care for other abnormalities of gravid uterus, unspecified trimester: Secondary | ICD-10-CM | POA: Diagnosis present

## 2014-03-10 DIAGNOSIS — O4290 Premature rupture of membranes, unspecified as to length of time between rupture and onset of labor, unspecified weeks of gestation: Secondary | ICD-10-CM | POA: Diagnosis present

## 2014-03-10 DIAGNOSIS — O09529 Supervision of elderly multigravida, unspecified trimester: Secondary | ICD-10-CM | POA: Diagnosis present

## 2014-03-10 DIAGNOSIS — IMO0002 Reserved for concepts with insufficient information to code with codable children: Secondary | ICD-10-CM | POA: Diagnosis present

## 2014-03-10 DIAGNOSIS — Z22330 Carrier of Group B streptococcus: Secondary | ICD-10-CM

## 2014-03-10 DIAGNOSIS — Z6835 Body mass index (BMI) 35.0-35.9, adult: Secondary | ICD-10-CM

## 2014-03-10 DIAGNOSIS — E669 Obesity, unspecified: Secondary | ICD-10-CM | POA: Diagnosis present

## 2014-03-10 DIAGNOSIS — Z833 Family history of diabetes mellitus: Secondary | ICD-10-CM

## 2014-03-10 DIAGNOSIS — O99214 Obesity complicating childbirth: Secondary | ICD-10-CM

## 2014-03-10 DIAGNOSIS — Z98891 History of uterine scar from previous surgery: Secondary | ICD-10-CM

## 2014-03-10 DIAGNOSIS — O34219 Maternal care for unspecified type scar from previous cesarean delivery: Secondary | ICD-10-CM | POA: Diagnosis present

## 2014-03-10 DIAGNOSIS — O3410 Maternal care for benign tumor of corpus uteri, unspecified trimester: Secondary | ICD-10-CM

## 2014-03-10 DIAGNOSIS — D259 Leiomyoma of uterus, unspecified: Secondary | ICD-10-CM | POA: Diagnosis present

## 2014-03-10 DIAGNOSIS — O99892 Other specified diseases and conditions complicating childbirth: Secondary | ICD-10-CM | POA: Diagnosis present

## 2014-03-10 HISTORY — DX: Nausea with vomiting, unspecified: R11.2

## 2014-03-10 HISTORY — DX: Other specified postprocedural states: Z98.890

## 2014-03-10 LAB — CBC
HCT: 32.9 % — ABNORMAL LOW (ref 36.0–46.0)
HEMOGLOBIN: 10.4 g/dL — AB (ref 12.0–15.0)
MCH: 21.6 pg — ABNORMAL LOW (ref 26.0–34.0)
MCHC: 31.6 g/dL (ref 30.0–36.0)
MCV: 68.4 fL — ABNORMAL LOW (ref 78.0–100.0)
Platelets: 194 10*3/uL (ref 150–400)
RBC: 4.81 MIL/uL (ref 3.87–5.11)
RDW: 17.9 % — ABNORMAL HIGH (ref 11.5–15.5)
WBC: 12.7 10*3/uL — AB (ref 4.0–10.5)

## 2014-03-10 LAB — TYPE AND SCREEN
ABO/RH(D): A POS
ANTIBODY SCREEN: NEGATIVE

## 2014-03-10 LAB — AMNISURE RUPTURE OF MEMBRANE (ROM) NOT AT ARMC: Amnisure ROM: POSITIVE

## 2014-03-10 MED ORDER — CITRIC ACID-SODIUM CITRATE 334-500 MG/5ML PO SOLN: 30.0000 mL | ORAL | Status: DC | PRN

## 2014-03-10 MED ORDER — LIDOCAINE HCL (PF) 1 % IJ SOLN
30.0000 mL | INTRAMUSCULAR | Status: DC | PRN
  Filled 2014-03-10: qty 30

## 2014-03-10 MED ORDER — PENICILLIN G POTASSIUM 5000000 UNITS IJ SOLR
2.5000 10*6.[IU] | INTRAMUSCULAR | Status: DC
  Administered 2014-03-11 (×2): 2.5 10*6.[IU] via INTRAVENOUS
  Filled 2014-03-10 (×5): qty 2.5

## 2014-03-10 MED ORDER — ACETAMINOPHEN 325 MG PO TABS: 650.0000 mg | ORAL_TABLET | ORAL | Status: DC | PRN

## 2014-03-10 MED ORDER — FENTANYL 2.5 MCG/ML BUPIVACAINE 1/10 % EPIDURAL INFUSION (WH - ANES)
14.0000 mL/h | INTRAMUSCULAR | Status: DC | PRN
  Administered 2014-03-10: 14 mL/h via EPIDURAL
  Filled 2014-03-10: qty 125

## 2014-03-10 MED ORDER — LIDOCAINE HCL (PF) 1 % IJ SOLN
INTRAMUSCULAR | Status: DC | PRN
  Administered 2014-03-10 (×2): 4 mL

## 2014-03-10 MED ORDER — OXYTOCIN 40 UNITS IN LACTATED RINGERS INFUSION - SIMPLE MED
62.5000 mL/h | INTRAVENOUS | Status: DC
  Administered 2014-03-11: 62.5 mL/h via INTRAVENOUS
  Filled 2014-03-10: qty 1000

## 2014-03-10 MED ORDER — OXYTOCIN BOLUS FROM INFUSION
500.0000 mL | INTRAVENOUS | Status: DC
  Administered 2014-03-11: 500 mL via INTRAVENOUS

## 2014-03-10 MED ORDER — LACTATED RINGERS IV SOLN: 500.0000 mL | Freq: Once | INTRAVENOUS | Status: DC

## 2014-03-10 MED ORDER — DIPHENHYDRAMINE HCL 50 MG/ML IJ SOLN: 12.5000 mg | INTRAMUSCULAR | Status: DC | PRN

## 2014-03-10 MED ORDER — ONDANSETRON HCL 4 MG/2ML IJ SOLN: 4.0000 mg | Freq: Four times a day (QID) | INTRAMUSCULAR | Status: DC | PRN

## 2014-03-10 MED ORDER — EPHEDRINE 5 MG/ML INJ
10.0000 mg | INTRAVENOUS | Status: DC | PRN
  Filled 2014-03-10: qty 2

## 2014-03-10 MED ORDER — SODIUM CHLORIDE 0.9 % IV SOLN: 250.0000 mL | INTRAVENOUS | Status: DC | PRN

## 2014-03-10 MED ORDER — PHENYLEPHRINE 40 MCG/ML (10ML) SYRINGE FOR IV PUSH (FOR BLOOD PRESSURE SUPPORT)
80.0000 ug | PREFILLED_SYRINGE | INTRAVENOUS | Status: DC | PRN
  Filled 2014-03-10: qty 2
  Filled 2014-03-10: qty 10

## 2014-03-10 MED ORDER — PHENYLEPHRINE 40 MCG/ML (10ML) SYRINGE FOR IV PUSH (FOR BLOOD PRESSURE SUPPORT)
80.0000 ug | PREFILLED_SYRINGE | INTRAVENOUS | Status: DC | PRN
  Administered 2014-03-11: 40 ug via INTRAVENOUS
  Filled 2014-03-10: qty 2

## 2014-03-10 MED ORDER — LACTATED RINGERS IV SOLN
INTRAVENOUS | Status: DC
  Administered 2014-03-11: 01:00:00 via INTRAVENOUS

## 2014-03-10 MED ORDER — FENTANYL 2.5 MCG/ML BUPIVACAINE 1/10 % EPIDURAL INFUSION (WH - ANES)
INTRAMUSCULAR | Status: DC | PRN
  Administered 2014-03-10: 14 mL/h via EPIDURAL

## 2014-03-10 MED ORDER — PENICILLIN G POTASSIUM 5000000 UNITS IJ SOLR
5.0000 10*6.[IU] | Freq: Once | INTRAMUSCULAR | Status: AC
  Administered 2014-03-10: 5 10*6.[IU] via INTRAVENOUS
  Filled 2014-03-10: qty 5

## 2014-03-10 MED ORDER — LACTATED RINGERS IV SOLN
500.0000 mL | INTRAVENOUS | Status: DC | PRN
  Administered 2014-03-10: 1000 mL via INTRAVENOUS

## 2014-03-10 MED ORDER — OXYCODONE-ACETAMINOPHEN 5-325 MG PO TABS: 1.0000 | ORAL_TABLET | ORAL | Status: DC | PRN

## 2014-03-10 MED ORDER — SODIUM CHLORIDE 0.9 % IJ SOLN: 3.0000 mL | Freq: Two times a day (BID) | INTRAMUSCULAR | Status: DC

## 2014-03-10 MED ORDER — SODIUM CHLORIDE 0.9 % IJ SOLN: 3.0000 mL | INTRAMUSCULAR | Status: DC | PRN

## 2014-03-10 MED ORDER — IBUPROFEN 600 MG PO TABS: 600.0000 mg | ORAL_TABLET | Freq: Four times a day (QID) | ORAL | Status: DC | PRN

## 2014-03-10 NOTE — Progress Notes (Signed)
  Subjective: Pt is reports UCs are stronger and closer.  Pt is having difficulty with back labor.  Discussed pain management options.  Pt opts for epidural at this time.  Husband at bedside and supportive.  Objective: BP 116/75  Pulse 92  Temp(Src) 97.9 F (36.6 C) (Oral)  Resp 20  Ht 5\' 6"  (1.676 m)  Wt 218 lb 2 oz (98.941 kg)  BMI 35.22 kg/m2  Breastfeeding? Unknown      FHT: IA - FHT reassuring UC:   Q 3 min  SVE:   Dilation: 4 Effacement (%): 80 Station: -1 Exam by:: Khaniyah Bezek CNM   SROM at 3704 today, spontaneous labor, labor is now progressing Labor: Progressing normally  Preeclampsia: BPs WNL  Fetal Wellbeing: IA - FHT reassuring  Pain Control: Requests epidural at this time  I/D: GBS pos; PCN per protocol; SROM x 10 hours; Afebrile  Anticipated MOD: NSVD    Shayona Hibbitts 11/18/2013, 7:23 AM

## 2014-03-10 NOTE — H&P (Signed)
[name] is a 38 y.o. female, [id number] at [redacted]w[redacted]d, presenting for PROM with mild UCs.  SROM at 1:15pm with clear fluid, UCs started around 8:30am this morning.  Patient Active Problem List   Diagnosis Date Noted  . Uterine fibroid 03/10/2014  . Marginal insertion of umbilical cord 29/93/7169  . Advanced maternal age (AMA) in pregnancy 03/10/2014  . H/O: cesarean section 03/10/2014    History of present pregnancy: Patient entered care at 10 weeks.   EDC of 03/09/14 was established by LMP.   Anatomy scan:  19 weeks, with normal findings and an anterior placenta.  Marginal cord noted. Additional [place] evaluations:  35 weeks for marginal cord - EFW 55th%ile, AFI normal, anterior placenta, vtx.   Significant prenatal events:  none   Last evaluation:  03/08/14 at [redacted]w[redacted]d    2cm / 50% / -2  OB History   Grav Para Term Preterm Abortions TAB SAB Ect Mult Living   4 2 2  1  1   2      Past Medical History  Diagnosis Date  . Fibroid     ?  Marland Kitchen PONV (postoperative nausea and vomiting)    Past Surgical History  Procedure Laterality Date  . Cesarean section    . Uterine fibroid surgery    . Irrigation and debridement sebaceous cyst      on her back   Family History: family history includes Cancer in her father and paternal grandmother; Diabetes in her father; Fibromyalgia in her mother; Heart disease in her father; Hypertension in her paternal grandmother; Osteoarthritis in her mother; Other in her mother; Scoliosis in her mother. Social History:  reports that she has never smoked. She does not have any smokeless tobacco history on file. She reports that she does not drink alcohol or use illicit drugs.   Prenatal Transfer Tool  Maternal Diabetes: No Genetic Screening: Declined Maternal Ultrasounds/Referrals: Abnormal:  Findings:   Other:marginal cord Fetal Ultrasounds or other Referrals:  None Maternal Substance Abuse:  No Significant Maternal Medications:  None Significant Maternal Lab  Results: Lab values include: Group B Strep positive    ROS: see HPI above, all other systems are negative   No Known Allergies   Dilation: 2 Effacement (%): 70 Station: -2 Exam by:: J. Geana Walts CNM Blood pressure 116/77, pulse 107, temperature 97.9 F (36.6 C), temperature source Oral, resp. rate 18, height 5\' 6"  (1.676 m), weight 218 lb 2 oz (98.941 kg), unknown if currently breastfeeding.  Chest clear Heart RRR without murmur Abd gravid, NT Ext: WNL  FHR: Cat I UCs:  Q 6 min  Prenatal labs: ABO, Rh: A/Positive/-- (12/24 0000) Antibody: Negative (12/24 0000) Rubella:   Immune RPR: Nonreactive (12/24 0000)  HBsAg: Negative (12/24 0000)  HIV: Non-reactive (12/24 0000)  GBS: Positive (06/25 0000) Sickle cell/Hgb electrophoresis:  n/a Pap:  08/31/13  WNL GC:  Neg Chlamydia:  Neg Genetic screenings:  Declined Glucola:  121 Other:  none    A/P IUP at [redacted]w[redacted]d PROM with mild UCs GBS pos Marginal cord  Admit to BS per c/w Dr. Nelda Marseille GBS prophylaxis per protocol   Emilee Hero, MSN 03/10/2014, 8:42 PM

## 2014-03-10 NOTE — MAU Note (Signed)
At 1:15 pm, clear leaking fluid small amt and put on a pad.  States has not been very much since then.  Bloody show at 4:30 pm.  Contractions all day since 8:30 am.  2 cm on Thursday in office. GBS positive pt states.

## 2014-03-10 NOTE — Anesthesia Preprocedure Evaluation (Addendum)
Anesthesia Evaluation  Patient identified by MRN, date of birth, ID band Patient awake    Reviewed: Allergy & Precautions, H&P , Patient's Chart, lab work & pertinent test results  History of Anesthesia Complications (+) PONV and history of anesthetic complications  Airway Mallampati: III TM Distance: >3 FB Neck ROM: Full    Dental no notable dental hx. (+) Teeth Intact   Pulmonary neg pulmonary ROS,  breath sounds clear to auscultation  Pulmonary exam normal       Cardiovascular negative cardio ROS  Rhythm:Regular Rate:Normal     Neuro/Psych negative neurological ROS  negative psych ROS   GI/Hepatic negative GI ROS, Neg liver ROS,   Endo/Other  Obesity  Renal/GU negative Renal ROS  negative genitourinary   Musculoskeletal negative musculoskeletal ROS (+)   Abdominal (+) + obese,   Peds  Hematology  (+) anemia ,   Anesthesia Other Findings   Reproductive/Obstetrics (+) Pregnancy Previous C/Section AMA Hx/o myomectomy                          Anesthesia Physical Anesthesia Plan  ASA: II  Anesthesia Plan: Epidural   Post-op Pain Management:    Induction:   Airway Management Planned: Natural Airway  Additional Equipment:   Intra-op Plan:   Post-operative Plan:   Informed Consent: I have reviewed the patients History and Physical, chart, labs and discussed the procedure including the risks, benefits and alternatives for the proposed anesthesia with the patient or authorized representative who has indicated his/her understanding and acceptance.     Plan Discussed with: Anesthesiologist  Anesthesia Plan Comments:         Anesthesia Quick Evaluation

## 2014-03-10 NOTE — Anesthesia Procedure Notes (Signed)
Epidural Patient location during procedure: OB Start time: 03/10/2014 11:39 PM  Staffing Anesthesiologist: Lauro Manlove A.  Preanesthetic Checklist Completed: patient identified, site marked, surgical consent, pre-op evaluation, timeout performed, IV checked, risks and benefits discussed and monitors and equipment checked  Epidural Patient position: sitting Prep: site prepped and draped and DuraPrep Patient monitoring: continuous pulse ox and blood pressure Approach: midline Injection technique: LOR air  Needle:  Needle type: Tuohy  Needle gauge: 17 G Needle length: 9 cm and 9 Needle insertion depth: 5 cm cm Catheter type: closed end flexible Catheter size: 19 Gauge Catheter at skin depth: 9 cm Test dose: negative and Other  Assessment Events: blood not aspirated, injection not painful, no injection resistance, negative IV test and no paresthesia  Additional Notes Patient identified. Risks and benefits discussed including failed block, incomplete  Pain control, post dural puncture headache, nerve damage, paralysis, blood pressure Changes, nausea, vomiting, reactions to medications-both toxic and allergic and post Partum back pain. All questions were answered. Patient expressed understanding and wished to proceed. Sterile technique was used throughout procedure. Epidural site was Dressed with sterile barrier dressing. No paresthesias, signs of intravascular injection Or signs of intrathecal spread were encountered.  Patient was more comfortable after the epidural was dosed. Please see RN's note for documentation of vital signs and FHR which are stable.

## 2014-03-11 ENCOUNTER — Encounter (HOSPITAL_COMMUNITY): Payer: Self-pay

## 2014-03-11 LAB — RPR

## 2014-03-11 LAB — ABO/RH: ABO/RH(D): A POS

## 2014-03-11 MED ORDER — IBUPROFEN 600 MG PO TABS
600.0000 mg | ORAL_TABLET | Freq: Four times a day (QID) | ORAL | Status: DC
  Administered 2014-03-11 – 2014-03-13 (×9): 600 mg via ORAL
  Filled 2014-03-11 (×9): qty 1

## 2014-03-11 MED ORDER — LANOLIN HYDROUS EX OINT: TOPICAL_OINTMENT | CUTANEOUS | Status: DC | PRN

## 2014-03-11 MED ORDER — DIPHENHYDRAMINE HCL 25 MG PO CAPS: 25.0000 mg | ORAL_CAPSULE | Freq: Four times a day (QID) | ORAL | Status: DC | PRN

## 2014-03-11 MED ORDER — OXYCODONE-ACETAMINOPHEN 5-325 MG PO TABS: 1.0000 | ORAL_TABLET | ORAL | Status: DC | PRN

## 2014-03-11 MED ORDER — ONDANSETRON HCL 4 MG PO TABS: 4.0000 mg | ORAL_TABLET | ORAL | Status: DC | PRN

## 2014-03-11 MED ORDER — PRENATAL MULTIVITAMIN CH
1.0000 | ORAL_TABLET | Freq: Every day | ORAL | Status: DC
  Administered 2014-03-11 – 2014-03-12 (×2): 1 via ORAL
  Filled 2014-03-11 (×2): qty 1

## 2014-03-11 MED ORDER — BENZOCAINE-MENTHOL 20-0.5 % EX AERO
1.0000 "application " | INHALATION_SPRAY | CUTANEOUS | Status: DC | PRN
  Administered 2014-03-11: 1 via TOPICAL
  Filled 2014-03-11: qty 56

## 2014-03-11 MED ORDER — SIMETHICONE 80 MG PO CHEW: 80.0000 mg | CHEWABLE_TABLET | ORAL | Status: DC | PRN

## 2014-03-11 MED ORDER — WITCH HAZEL-GLYCERIN EX PADS: 1.0000 "application " | MEDICATED_PAD | CUTANEOUS | Status: DC | PRN

## 2014-03-11 MED ORDER — SENNOSIDES-DOCUSATE SODIUM 8.6-50 MG PO TABS
2.0000 | ORAL_TABLET | ORAL | Status: DC
  Administered 2014-03-11 – 2014-03-12 (×2): 2 via ORAL
  Filled 2014-03-11 (×2): qty 2

## 2014-03-11 MED ORDER — TETANUS-DIPHTH-ACELL PERTUSSIS 5-2.5-18.5 LF-MCG/0.5 IM SUSP
0.5000 mL | Freq: Once | INTRAMUSCULAR | Status: DC
Start: 2014-03-12 — End: 2014-03-13

## 2014-03-11 MED ORDER — DIBUCAINE 1 % RE OINT: 1.0000 "application " | TOPICAL_OINTMENT | RECTAL | Status: DC | PRN

## 2014-03-11 MED ORDER — ZOLPIDEM TARTRATE 5 MG PO TABS: 5.0000 mg | ORAL_TABLET | Freq: Every evening | ORAL | Status: DC | PRN

## 2014-03-11 MED ORDER — ONDANSETRON HCL 4 MG/2ML IJ SOLN: 4.0000 mg | INTRAMUSCULAR | Status: DC | PRN

## 2014-03-11 NOTE — Progress Notes (Addendum)
  Subjective: Called to room after a prolong decel.  BP 94/49 - phenylephrine given  Position changes, and bolus were used with resolution.   Pt is comfortable with epidural and able to get some sleep.  Husband at bedside and supportive.  Objective: BP 94/49  Pulse 97  Temp(Src) 97.9 F (36.6 C) (Oral)  Resp 20  Ht 5\' 6"  (1.676 m)  Wt 218 lb 2 oz (98.941 kg)  BMI 35.22 kg/m2  SpO2 100%  Breastfeeding? Unknown      FHT: Category II UC:   regular, every 4 minutes  SVE:   Dilation: 7.5 Effacement (%): 80 Station: -1 Exam by:: Latifa Noble CNM   Spontaneous labor, progressing normally  Labor: Progressing normally  Preeclampsia: BPs WNL  Fetal Wellbeing: Category II  Pain Control: Epidural  I/D: GBS pos; PCN per protocol; SROM x 10 hours; Afebrile    Anticipated MOD: NSVD    [name] 11/18/2013, 7:23 AM

## 2014-03-11 NOTE — Lactation Note (Signed)
This note was copied from the chart of Boy Jenette Rayson. Lactation Consultation Note  Patient Name: Boy Adeena Bernabe [id number] Date: 03/11/2014 Reason for consult: Initial assessment Mom is experienced BF having BF her older 2 children greater than 2 years and also reports she is an [place]. Mom reports this baby is nursing well, denies questions or concerns. Lactation brochure left for review, advised of OP services and support group. Encouraged to call if she would like assist.   Maternal Data Formula Feeding for Exclusion: No Infant to breast within first hour of birth: Yes Has patient been taught Hand Expression?: No (Mom reports she knows how to hand express) Does the patient have breastfeeding experience prior to this delivery?: Yes  Feeding Feeding Type: Breast Fed Length of feed: 35 min  LATCH Score/Interventions                      Lactation Tools Discussed/Used WIC Program: No   Consult Status Consult Status: PRN    Katrine Coho 03/11/2014, 7:32 PM

## 2014-03-11 NOTE — Progress Notes (Signed)
[name]  Post Partum Day 0:S/P VAVD with Labial and Perineal Lacerations  Subjective: Patient with bright affect regarding VBAC delivery and newborn.  Patient up ad lib, denies current syncope or dizziness.  Reports consuming regular diet without issues and denies N/V. Plans to breastfeed. Contraception and Circumcision not discussed.   Objective: Blood pressure 99/78, pulse 99, temperature 97.6 F (36.4 C), temperature source Oral, resp. rate 16, height 5\' 6"  (1.676 m), weight 218 lb 2 oz (98.941 kg), SpO2 100.00%, unknown if currently breastfeeding.  Physical Exam:  General: alert, cooperative and no distress Lochia: appropriate Uterine Fundus: firm Incision: N/A DVT Evaluation: Not Assessed   Recent Labs  03/10/14 2050  HGB 10.4*  HCT 32.9*    Assessment S/P VA-Vaginal Delivery-Day 0 Breastfeeding Hemodynamically Stable  Plan: Routine PP Orders  [name], CNM 03/11/2014, 9:47 AM

## 2014-03-11 NOTE — Progress Notes (Addendum)
  Subjective: Pt is comfortable with epidural.  Husband at bedside and supportive.  Objective: BP 106/65  Pulse 82  Temp(Src) 97.9 F (36.6 C) (Oral)  Resp 18  Ht 5\' 6"  (1.676 m)  Wt 218 lb 2 oz (98.941 kg)  BMI 35.22 kg/m2  SpO2 99%  Breastfeeding? Unknown      FHT: Category II UC:   regular, every 2-3 minutes  SVE:   Dilation: Lip/rim Effacement (%): 80 Station: -1 Exam by:: Aaron Boeh CNM   Spontaneous labor, progressing normally  Labor: Progressing normally  Preeclampsia: BPs WNL  Fetal Wellbeing: Category II Pain Control: Epidural  I/D: GBS pos; PCN per protocol; SROM x 13 hours; Afebrile     Anticipated MOD: NSVD    Bertie Mcconathy 11/18/2013, 7:23 AM

## 2014-03-11 NOTE — Progress Notes (Signed)
  Subjective: Pt is comfortable with epidural.  Husband at bedside and supportive.  Objective: BP 109/67  Pulse 92  Temp(Src) 97.7 F (36.5 C) (Oral)  Resp 18  Ht 5\' 6"  (1.676 m)  Wt 218 lb 2 oz (98.941 kg)  BMI 35.22 kg/m2  SpO2 99%  Breastfeeding? Unknown      FHT: Category II - position changes and bolus UC:   regular, every 2-3 minutes  SVE:   Dilation: Lip/rim Effacement (%): 90 Station: 0 Exam by:: Jayanth Szczesniak CNM   Spontaneous labor, progressing normally  Labor: Progressing normally  Preeclampsia: BPs WNL  Fetal Wellbeing: Category II  Pain Control: Epidural  I/D: GBS pos; PCN per protocol; SROM x 14 hours; Afebrile     Anticipated MOD: NSVD    [name] 11/18/2013, 7:23 AM

## 2014-03-11 NOTE — Anesthesia Postprocedure Evaluation (Signed)
Anesthesia Post Note  Patient: [name]  Procedure(s) Performed: * No procedures listed *  Anesthesia type: Epidural  Patient location: Mother/Baby  Post pain: Pain level controlled  Post assessment: Post-op Vital signs reviewed  Last Vitals:  Filed Vitals:   03/11/14 0701  BP: 99/78  Pulse: 99  Temp:   Resp: 16    Post vital signs: Reviewed  Level of consciousness:alert  Complications: No apparent anesthesia complications

## 2014-03-12 DIAGNOSIS — D649 Anemia, unspecified: Secondary | ICD-10-CM | POA: Diagnosis present

## 2014-03-12 LAB — CBC
HCT: 27.3 % — ABNORMAL LOW (ref 36.0–46.0)
Hemoglobin: 8.4 g/dL — ABNORMAL LOW (ref 12.0–15.0)
MCH: 21.2 pg — ABNORMAL LOW (ref 26.0–34.0)
MCHC: 30.8 g/dL (ref 30.0–36.0)
MCV: 68.9 fL — ABNORMAL LOW (ref 78.0–100.0)
PLATELETS: 153 10*3/uL (ref 150–400)
RBC: 3.96 MIL/uL (ref 3.87–5.11)
RDW: 18.1 % — AB (ref 11.5–15.5)
WBC: 10.7 10*3/uL — AB (ref 4.0–10.5)

## 2014-03-12 MED ORDER — OXYCODONE-ACETAMINOPHEN 5-325 MG PO TABS: 1.0000 | ORAL_TABLET | ORAL | Status: DC | PRN

## 2014-03-12 MED ORDER — FERROUS SULFATE 325 (65 FE) MG PO TABS: 325.0000 mg | ORAL_TABLET | Freq: Two times a day (BID) | ORAL | Status: DC

## 2014-03-12 MED ORDER — FERROUS SULFATE 325 (65 FE) MG PO TABS
325.0000 mg | ORAL_TABLET | Freq: Two times a day (BID) | ORAL | Status: DC
  Administered 2014-03-12 – 2014-03-13 (×3): 325 mg via ORAL
  Filled 2014-03-12 (×3): qty 1

## 2014-03-12 MED ORDER — IBUPROFEN 600 MG PO TABS: 600.0000 mg | ORAL_TABLET | Freq: Four times a day (QID) | ORAL | Status: DC

## 2014-03-12 NOTE — Progress Notes (Signed)
[name]    Subjective: Post Partum Day 1 Vaginal delivery, labial and perineal laceration Patient up ad lib, denies syncope or dizziness. Reports consuming regular diet without issues and denies N/V No issues with urination and reports bleeding is appropriate Feeding:  Breast Contraceptive plan:   Undecided   Objective: Temp:  [97.5 F (36.4 C)-98.1 F (36.7 C)] 97.8 F (36.6 C) (07/27 0550) Pulse Rate:  [75-89] 88 (07/27 0550) Resp:  [18-20] 18 (07/27 0550) BP: (91-130)/(53-68) 102/65 mmHg (07/27 0550) SpO2:  [99 %] 99 % (07/26 2043)  Physical Exam:  General: alert and cooperative Ext: WNL Breast: soft non tender Lungs: CTAB Abdomen:  + bowel sounds, non distended Lochia: appropriate Uterine Fundus: firm Laceration: healing well DVT Evaluation: No evidence of DVT seen on physical exam.    Recent Labs  03/10/14 2050 03/12/14 0630  HGB 10.4* 8.4*  HCT 32.9* 27.3*    Assessment S/P Vaginal Delivery-Day 1 Stable Normal Involution Breastfeeding/Bottlefeeding   Plan: Continue current care Plan for discharge tomorrow Lactation support   Mallori Araque, CNM, MSN 03/12/2014, 8:34 AM

## 2014-03-12 NOTE — Discharge Summary (Signed)
Vaginal Delivery Discharge Summary  [name]  DOB:    1976/06/18 MRN:    [id number] CSN:    [id number]  Date of admission:                  03/10/14  Date of discharge:                   03/13/14  Procedures this admission:  VAVD VBAC  Date of Delivery: 03/11/14  Newborn Data:  Live born female  Birth Weight: 8 lb 3.9 oz (3740 g) APGAR: 9, 9  Home with mother. Name: [name] Circumcision Plan: out patient  History of Present Illness:  [name] is a 38 y.o. female, [phone], who presents at [redacted]w[redacted]d weeks gestation. The patient has been followed at the [facility] and Gynecology division of [facility]. She was admitted rupture of membranes. Her pregnancy has been complicated by:  Patient Active Problem List   Diagnosis Date Noted  . Uterine fibroid 03/10/2014  . Advanced maternal age (AMA) in pregnancy 03/10/2014  . H/O: cesarean section 03/10/2014  asymptotic anemia GBS+  Hospital course:  The patient was admitted for PROM.   Her labor was not complicated. She proceeded to have a vaginal delivery of a healthy infant. Her delivery was not complicated. Her postpartum course was not complicated. She was discharged to home on postpartum day 2 doing well.  Feeding:  breast  Contraception:  unsure  Discharge hemoglobin:  Hemoglobin  Date Value Ref Range Status  03/12/2014 8.4* 12.0 - 15.0 g/dL Final     REPEATED TO VERIFY     DELTA CHECK NOTED     HCT  Date Value Ref Range Status  03/12/2014 27.3* 36.0 - 46.0 % Final   Asymptomatic Anemia,     Discharge Physical Exam:   General: alert and cooperative Lochia: appropriate Uterine Fundus: firm Incision: healing well DVT Evaluation: No evidence of DVT seen on physical exam.  Intrapartum Procedures: vacuum and VBAC Postpartum Procedures: none Complications-Operative and Postpartum: labial and perineal  Discharge Diagnoses: Term  Pregnancy-delivered  Discharge Information:  Activity:           pelvic rest Diet:                routine Medications: PNV, Ibuprofen, Iron and Percocet Condition:      stable    Postpartum Teaching: Nutrition, exercise, return to work or school, family visits, sexual activity, home rest, vaginal bleeding, pelvic rest, family planning, s/s of PPD, breast care peri-care and incision care  Discharge to: home  Follow-up Information   Follow up with [facility]. Schedule an appointment as soon as possible for a visit in 6 weeks. (Call with any questions or concerns)    Specialty:  Obstetrics and Gynecology   Contact information:   [address] [place] [id number] [place], CNM, MSN 03/12/2014. 9:59 PM   Postpartum Care After Vaginal Delivery  After you deliver your newborn (postpartum period), the usual stay in the hospital is 24 72 hours. If there were problems with your labor or delivery, or if you have other medical problems, you might be in the hospital longer.  While you are in the hospital, you will receive help and instructions on how to care for yourself and your newborn during the postpartum period.  While you are in the hospital:  Be sure to tell  your nurses if you have pain or discomfort, as well as where you feel the pain and what makes the pain worse.  If you had an incision made near your vagina (episiotomy) or if you had some tearing during delivery, the nurses may put ice packs on your episiotomy or tear. The ice packs may help to reduce the pain and swelling.  If you are breastfeeding, you may feel uncomfortable contractions of your uterus for a couple of weeks. This is normal. The contractions help your uterus get back to normal size.  It is normal to have some bleeding after delivery.  For the first 1 3 days after delivery, the flow is red and the amount may be similar to a  period.  It is common for the flow to start and stop.  In the first few days, you may pass some small clots. Let your nurses know if you begin to pass large clots or your flow increases.  Do not  flush blood clots down the toilet before having the nurse look at them.  During the next 3 10 days after delivery, your flow should become more watery and pink or brown-tinged in color.  Ten to fourteen days after delivery, your flow should be a small amount of yellowish-white discharge.  The amount of your flow will decrease over the first few weeks after delivery. Your flow may stop in 6 8 weeks. Most women have had their flow stop by 12 weeks after delivery.  You should change your sanitary pads frequently.  Wash your hands thoroughly with soap and water for at least 20 seconds after changing pads, using the toilet, or before holding or feeding your newborn.  You should feel like you need to empty your bladder within the first 6 8 hours after delivery.  In case you become weak, lightheaded, or faint, call your nurse before you get out of bed for the first time and before you take a shower for the first time.  Within the first few days after delivery, your breasts may begin to feel tender and full. This is called engorgement. Breast tenderness usually goes away within 48 72 hours after engorgement occurs. You may also notice milk leaking from your breasts. If you are not breastfeeding, do not stimulate your breasts. Breast stimulation can make your breasts produce more milk.  Spending as much time as possible with your newborn is very important. During this time, you and your newborn can feel close and get to know each other. Having your newborn stay in your room (rooming in) will help to strengthen the bond with your newborn. It will give you time to get to know your newborn and become comfortable caring for your newborn.  Your hormones change after delivery. Sometimes the hormone changes can  temporarily cause you to feel sad or tearful. These feelings should not last more than a few days. If these feelings last longer than that, you should talk to your caregiver.  If desired, talk to your caregiver about methods of family planning or contraception.  Talk to your caregiver about immunizations. Your caregiver may want you to have the following immunizations before leaving the hospital:  Tetanus, diphtheria, and pertussis (Tdap) or tetanus and diphtheria (Td) immunization. It is very important that you and your family (including grandparents) or others caring for your newborn are up-to-date with the Tdap or Td immunizations. The Tdap or Td immunization can help protect your newborn from getting ill.  Rubella immunization.  Varicella (  chickenpox) immunization.  Influenza immunization. You should receive this annual immunization if you did not receive the immunization during your pregnancy. Document Released: 05/31/2007 Document Revised: 04/27/2012 Document Reviewed: 03/30/2012 [facility] Patient Information 2014 [place].   Postpartum Depression and Baby Blues  The postpartum period begins right after the birth of a baby. During this time, there is often a great amount of joy and excitement. It is also a time of considerable changes in the life of the parent(s). Regardless of how many times a mother gives birth, each child brings new challenges and dynamics to the family. It is not unusual to have feelings of excitement accompanied by confusing shifts in moods, emotions, and thoughts. All mothers are at risk of developing postpartum depression or the "baby blues." These mood changes can occur right after giving birth, or they may occur many months after giving birth. The baby blues or postpartum depression can be mild or severe. Additionally, postpartum depression can resolve rather quickly, or it can be a long-term condition. CAUSES Elevated hormones and their rapid decline are  thought to be a main cause of postpartum depression and the baby blues. There are a number of hormones that radically change during and after pregnancy. Estrogen and progesterone usually decrease immediately after delivering your baby. The level of thyroid hormone and various cortisol steroids also rapidly drop. Other factors that play a major role in these changes include major life events and genetics.  RISK FACTORS If you have any of the following risks for the baby blues or postpartum depression, know what symptoms to watch out for during the postpartum period. Risk factors that may increase the likelihood of getting the baby blues or postpartum depression include:  Havinga personal or family history of depression.  Having depression while being pregnant.  Having premenstrual or oral contraceptive-associated mood issues.  Having exceptional life stress.  Having marital conflict.  Lacking a social support network.  Having a baby with special needs.  Having health problems such as diabetes. SYMPTOMS Baby blues symptoms include:  Brief fluctuations in mood, such as going from extreme happiness to sadness.  Decreased concentration.  Difficulty sleeping.  Crying spells, tearfulness.  Irritability.  Anxiety. Postpartum depression symptoms typically begin within the first month after giving birth. These symptoms include:  Difficulty sleeping or excessive sleepiness.  Marked weight loss.  Agitation.  Feelings of worthlessness.  Lack of interest in activity or food. Postpartum psychosis is a very concerning condition and can be dangerous. Fortunately, it is rare. Displaying any of the following symptoms is cause for immediate medical attention. Postpartum psychosis symptoms include:  Hallucinations and delusions.  Bizarre or disorganized behavior.  Confusion or disorientation. DIAGNOSIS  A diagnosis is made by an evaluation of your symptoms. There are no medical or lab  tests that lead to a diagnosis, but there are various questionnaires that a caregiver may use to identify those with the baby blues, postpartum depression, or psychosis. Often times, a screening tool called the [place] Postnatal Depression Scale is used to diagnose depression in the postpartum period.  TREATMENT The baby blues usually goes away on its own in 1 to 2 weeks. Social support is often all that is needed. You should be encouraged to get adequate sleep and rest. Occasionally, you may be given medicines to help you sleep.  Postpartum depression requires treatment as it can last several months or longer if it is not treated. Treatment may include individual or group therapy, medicine, or both to address any  social, physiological, and psychological factors that may play a role in the depression. Regular exercise, a healthy diet, rest, and social support may also be strongly recommended.  Postpartum psychosis is more serious and needs treatment right away. Hospitalization is often needed. HOME CARE INSTRUCTIONS  Get as much rest as you can. Nap when the baby sleeps.  Exercise regularly. Some women find yoga and walking to be beneficial.  Eat a balanced and nourishing diet.  Do little things that you enjoy. Have a cup of tea, take a bubble bath, read your favorite magazine, or listen to your favorite music.  Avoid alcohol.  Ask for help with household chores, cooking, grocery shopping, or running errands as needed. Do not try to do everything.  Talk to people close to you about how you are feeling. Get support from your partner, family members, friends, or other new moms.  Try to stay positive in how you think. Think about the things you are grateful for.  Do not spend a lot of time alone.  Only take medicine as directed by your caregiver.  Keep all your postpartum appointments.  Let your caregiver know if you have any concerns. SEEK MEDICAL CARE IF: You are having a reaction or  problems with your medicine. SEEK IMMEDIATE MEDICAL CARE IF:  You have suicidal feelings.  You feel you may harm the baby or someone else. Document Released: 05/07/2004 Document Revised: 10/26/2011 Document Reviewed: 06/09/2011 [facility] Patient Information 2014 [place], [place].

## 2014-03-12 NOTE — Discharge Instructions (Signed)
Postpartum Depression and Baby Blues °The postpartum period begins right after the birth of a baby. During this time, there is often a great amount of joy and excitement. It is also a time of many changes in the life of the parents. Regardless of how many times a mother gives birth, each child brings new challenges and dynamics to the family. It is not unusual to have feelings of excitement along with confusing shifts in moods, emotions, and thoughts. All mothers are at risk of developing postpartum depression or the "baby blues." These mood changes can occur right after giving birth, or they may occur many months after giving birth. The baby blues or postpartum depression can be mild or severe. Additionally, postpartum depression can go away rather quickly, or it can be a long-term condition.  °CAUSES °Raised hormone levels and the rapid drop in those levels are thought to be a main cause of postpartum depression and the baby blues. A number of hormones change during and after pregnancy. Estrogen and progesterone usually decrease right after the delivery of your baby. The levels of thyroid hormone and various cortisol steroids also rapidly drop. Other factors that play a role in these mood changes include major life events and genetics.  °RISK FACTORS °If you have any of the following risks for the baby blues or postpartum depression, know what symptoms to watch out for during the postpartum period. Risk factors that may increase the likelihood of getting the baby blues or postpartum depression include: °· Having a personal or family history of depression.   °· Having depression while being pregnant.   °· Having premenstrual mood issues or mood issues related to oral contraceptives. °· Having a lot of life stress.   °· Having marital conflict.   °· Lacking a social support network.   °· Having a baby with special needs.   °· Having health problems, such as diabetes.   °SIGNS AND SYMPTOMS °Symptoms of baby blues  include: °· Brief changes in mood, such as going from extreme happiness to sadness. °· Decreased concentration.   °· Difficulty sleeping.   °· Crying spells, tearfulness.   °· Irritability.   °· Anxiety.   °Symptoms of postpartum depression typically begin within the first month after giving birth. These symptoms include: °· Difficulty sleeping or excessive sleepiness.   °· Marked weight loss.   °· Agitation.   °· Feelings of worthlessness.   °· Lack of interest in activity or food.   °Postpartum psychosis is a very serious condition and can be dangerous. Fortunately, it is rare. Displaying any of the following symptoms is cause for immediate medical attention. Symptoms of postpartum psychosis include:  °· Hallucinations and delusions.   °· Bizarre or disorganized behavior.   °· Confusion or disorientation.   °DIAGNOSIS  °A diagnosis is made by an evaluation of your symptoms. There are no medical or lab tests that lead to a diagnosis, but there are various questionnaires that a health care provider may use to identify those with the baby blues, postpartum depression, or psychosis. Often, a screening tool called the Edinburgh Postnatal Depression Scale is used to diagnose depression in the postpartum period.  °TREATMENT °The baby blues usually goes away on its own in 1-2 weeks. Social support is often all that is needed. You will be encouraged to get adequate sleep and rest. Occasionally, you may be given medicines to help you sleep.  °Postpartum depression requires treatment because it can last several months or longer if it is not treated. Treatment may include individual or group therapy, medicine, or both to address any social, physiological, and psychological   factors that may play a role in the depression. Regular exercise, a healthy diet, rest, and social support may also be strongly recommended.  Postpartum psychosis is more serious and needs treatment right away. Hospitalization is often needed. HOME CARE  INSTRUCTIONS  Get as much rest as you can. Nap when the baby sleeps.   Exercise regularly. Some women find yoga and walking to be beneficial.   Eat a balanced and nourishing diet.   Do little things that you enjoy. Have a cup of tea, take a bubble bath, read your favorite magazine, or listen to your favorite music.  Avoid alcohol.   Ask for help with household chores, cooking, grocery shopping, or running errands as needed. Do not try to do everything.   Talk to people close to you about how you are feeling. Get support from your partner, family members, friends, or other new moms.  Try to stay positive in how you think. Think about the things you are grateful for.   Do not spend a lot of time alone.   Only take over-the-counter or prescription medicine as directed by your health care provider.  Keep all your postpartum appointments.   Let your health care provider know if you have any concerns.  SEEK MEDICAL CARE IF: You are having a reaction to or problems with your medicine. SEEK IMMEDIATE MEDICAL CARE IF:  You have suicidal feelings.   You think you may harm the baby or someone else. MAKE SURE YOU:  Understand these instructions.  Will watch your condition.  Will get help right away if you are not doing well or get worse. Document Released: 05/07/2004 Document Revised: 08/08/2013 Document Reviewed: 05/15/2013 [facility] Patient Information 2015 [place], [place]. This information is not intended to replace advice given to you by your health care provider. Make sure you discuss any questions you have with your health care provider. Breastfeeding Deciding to breastfeed is one of the best choices you can make for you and your baby. A change in hormones during pregnancy causes your breast tissue to grow and increases the number and size of your milk ducts. These hormones also allow proteins, sugars, and fats from your blood supply to make breast milk in your  milk-producing glands. Hormones prevent breast milk from being released before your baby is born as well as prompt milk flow after birth. Once breastfeeding has begun, thoughts of your baby, as well as his or her sucking or crying, can stimulate the release of milk from your milk-producing glands.  BENEFITS OF BREASTFEEDING For Your Baby  Your first milk (colostrum) helps your baby's digestive system function better.   There are antibodies in your milk that help your baby fight off infections.   Your baby has a lower incidence of asthma, allergies, and sudden infant death syndrome.   The nutrients in breast milk are better for your baby than infant formulas and are designed uniquely for your baby's needs.   Breast milk improves your baby's brain development.   Your baby is less likely to develop other conditions, such as childhood obesity, asthma, or type 2 diabetes mellitus.  For You   Breastfeeding helps to create a very special bond between you and your baby.   Breastfeeding is convenient. Breast milk is always available at the correct temperature and costs nothing.   Breastfeeding helps to burn calories and helps you lose the weight gained during pregnancy.   Breastfeeding makes your uterus contract to its prepregnancy size faster and slows bleeding (  lochia) after you give birth.   Breastfeeding helps to lower your risk of developing type 2 diabetes mellitus, osteoporosis, and breast or ovarian cancer later in life. SIGNS THAT YOUR BABY IS HUNGRY Early Signs of Hunger  Increased alertness or activity.  Stretching.  Movement of the head from side to side.  Movement of the head and opening of the mouth when the corner of the mouth or cheek is stroked (rooting).  Increased sucking sounds, smacking lips, cooing, sighing, or squeaking.  Hand-to-mouth movements.  Increased sucking of fingers or hands. Late Signs of Hunger  Fussing.  Intermittent crying. Extreme  Signs of Hunger Signs of extreme hunger will require calming and consoling before your baby will be able to breastfeed successfully. Do not wait for the following signs of extreme hunger to occur before you initiate breastfeeding:   Restlessness.  A loud, strong cry.   Screaming. BREASTFEEDING BASICS Breastfeeding Initiation  Find a comfortable place to sit or lie down, with your neck and back well supported.  Place a pillow or rolled up blanket under your baby to bring him or her to the level of your breast (if you are seated). Nursing pillows are specially designed to help support your arms and your baby while you breastfeed.  Make sure that your baby's abdomen is facing your abdomen.   Gently massage your breast. With your fingertips, massage from your chest wall toward your nipple in a circular motion. This encourages milk flow. You may need to continue this action during the feeding if your milk flows slowly.  Support your breast with 4 fingers underneath and your thumb above your nipple. Make sure your fingers are well away from your nipple and your baby's mouth.   Stroke your baby's lips gently with your finger or nipple.   When your baby's mouth is open wide enough, quickly bring your baby to your breast, placing your entire nipple and as much of the colored area around your nipple (areola) as possible into your baby's mouth.   More areola should be visible above your baby's upper lip than below the lower lip.   Your baby's tongue should be between his or her lower gum and your breast.   Ensure that your baby's mouth is correctly positioned around your nipple (latched). Your baby's lips should create a seal on your breast and be turned out (everted).  It is common for your baby to suck about 2-3 minutes in order to start the flow of breast milk. Latching Teaching your baby how to latch on to your breast properly is very important. An improper latch can cause nipple  pain and decreased milk supply for you and poor weight gain in your baby. Also, if your baby is not latched onto your nipple properly, he or she may swallow some air during feeding. This can make your baby fussy. Burping your baby when you switch breasts during the feeding can help to get rid of the air. However, teaching your baby to latch on properly is still the best way to prevent fussiness from swallowing air while breastfeeding. Signs that your baby has successfully latched on to your nipple:    Silent tugging or silent sucking, without causing you pain.   Swallowing heard between every 3-4 sucks.    Muscle movement above and in front of his or her ears while sucking.  Signs that your baby has not successfully latched on to nipple:   Sucking sounds or smacking sounds from your baby while  breastfeeding.  Nipple pain. If you think your baby has not latched on correctly, slip your finger into the corner of your baby's mouth to break the suction and place it between your baby's gums. Attempt breastfeeding initiation again. Signs of Successful Breastfeeding Signs from your baby:   A gradual decrease in the number of sucks or complete cessation of sucking.   Falling asleep.   Relaxation of his or her body.   Retention of a small amount of milk in his or her mouth.   Letting go of your breast by himself or herself. Signs from you:  Breasts that have increased in firmness, weight, and size 1-3 hours after feeding.   Breasts that are softer immediately after breastfeeding.  Increased milk volume, as well as a change in milk consistency and color by the fifth day of breastfeeding.   Nipples that are not sore, cracked, or bleeding. Signs That Your [employer]el Books is Getting Enough Milk  Wetting at least 3 diapers in a 24-hour period. The urine should be clear and pale yellow by age 40 days.  At least 3 stools in a 24-hour period by age 40 days. The stool should be soft and  yellow.  At least 3 stools in a 24-hour period by age 61 days. The stool should be seedy and yellow.  No loss of weight greater than 10% of birth weight during the first 32 days of age.  Average weight gain of 4-7 ounces (113-198 g) per week after age 44 days.  Consistent daily weight gain by age 61457 days, without weight loss after the age of 2 weeks. After a feeding, your baby may spit up a small amount. This is common. BREASTFEEDING FREQUENCY AND DURATION Frequent feeding will help you make more milk and can prevent sore nipples and breast engorgement. Breastfeed when you feel the need to reduce the fullness of your breasts or when your baby shows signs of hunger. This is called "breastfeeding on demand." Avoid introducing a pacifier to your baby while you are working to establish breastfeeding (the first 4-6 weeks after your baby is born). After this time you may choose to use a pacifier. Research has shown that pacifier use during the first year of a baby's life decreases the risk of sudden infant death syndrome (SIDS). Allow your baby to feed on each breast as long as he or she wants. Breastfeed until your baby is finished feeding. When your baby unlatches or falls asleep while feeding from the first breast, offer the second breast. Because newborns are often sleepy in the first few weeks of life, you may need to awaken your baby to get him or her to feed. Breastfeeding times will vary from baby to baby. However, the following rules can serve as a guide to help you ensure that your baby is properly fed:  Newborns (babies 47 weeks of age or younger) may breastfeed every 1-3 hours.  Newborns should not go longer than 3 hours during the day or 5 hours during the night without breastfeeding.  You should breastfeed your baby a minimum of 8 times in a 24-hour period until you begin to introduce solid foods to your baby at around 29 months of age. BREAST MILK PUMPING Pumping and storing breast milk  allows you to ensure that your baby is exclusively fed your breast milk, even at times when you are unable to breastfeed. This is especially important if Vaginal Delivery, Care After Refer to this sheet in the next few weeks.  These discharge instructions provide you with information on caring for yourself after delivery. Your caregiver may also give you specific instructions. Your treatment has been planned according to the most current medical practices available, but problems sometimes occur. Call your caregiver if you have any problems or questions after you go home. HOME CARE INSTRUCTIONS Take over-the-counter or prescription medicines only as directed by your caregiver or pharmacist. Do not drink alcohol, especially if you are breastfeeding or taking medicine to relieve pain. Do not chew or smoke tobacco. Do not use illegal drugs. Continue to use good perineal care. Good perineal care includes: Wiping your perineum from front to back. Keeping your perineum clean. Do not use tampons or douche until your caregiver says it is okay. Shower, wash your hair, and take tub baths as directed by your caregiver. Wear a well-fitting bra that provides breast support. Eat healthy foods. Drink enough fluids to keep your urine clear or pale yellow. Eat high-fiber foods such as whole grain cereals and breads, brown rice, beans, and fresh fruits and vegetables every day. These foods may help prevent or relieve constipation. Follow your caregiver's recommendations regarding resumption of activities such as climbing stairs, driving, lifting, exercising, or traveling. Talk to your caregiver about resuming sexual activities. Resumption of sexual activities is dependent upon your risk of infection, your rate of healing, and your comfort and desire to resume sexual activity. Try to have someone help you with your household activities and your newborn for at least a few days after you leave the hospital. Rest as much  as possible. Try to rest or take a nap when your newborn is sleeping. Increase your activities gradually. Keep all of your scheduled postpartum appointments. It is very important to keep your scheduled follow-up appointments. At these appointments, your caregiver will be checking to make sure that you are healing physically and emotionally. SEEK MEDICAL CARE IF:  You are passing large clots from your vagina. Save any clots to show your caregiver. You have a foul smelling discharge from your vagina. You have trouble urinating. You are urinating frequently. You have pain when you urinate. You have a change in your bowel movements. You have increasing redness, pain, or swelling near your vaginal incision (episiotomy) or vaginal tear. You have pus draining from your episiotomy or vaginal tear. Your episiotomy or vaginal tear is separating. You have painful, hard, or reddened breasts. You have a severe headache. You have blurred vision or see spots. You feel sad or depressed. You have thoughts of hurting yourself or your newborn. You have questions about your care, the care of your newborn, or medicines. You are dizzy or light-headed. You have a rash. You have nausea or vomiting. You were breastfeeding and have not had a menstrual period within 12 weeks after you stopped breastfeeding. You are not breastfeeding and have not had a menstrual period by the 12th week after delivery. You have a fever. SEEK IMMEDIATE MEDICAL CARE IF:  You have persistent pain. You have chest pain. You have shortness of breath. You faint. You have leg pain. You have stomach pain. Your vaginal bleeding saturates two or more sanitary pads in 1 hour. MAKE SURE YOU:  Understand these instructions. Will watch your condition. Will get help right away if you are not doing well or get worse. Document Released: 07/31/2000 Document Revised: 12/18/2013 Document Reviewed: 03/30/2012 [facility] Patient Information 2015  [place], [place]. This information is not intended to replace advice given to you by your health care provider.  Make sure you discuss any questions you have with your health care provider. you are going back to work while you are still breastfeeding or when you are not able to be present during feedings. Your lactation consultant can give you guidelines on how long it is safe to store breast milk.  A breast pump is a machine that allows you to pump milk from your breast into a sterile bottle. The pumped breast milk can then be stored in a refrigerator or freezer. Some breast pumps are operated by hand, while others use electricity. Ask your lactation consultant which type will work best for you. Breast pumps can be purchased, but some hospitals and breastfeeding support groups lease breast pumps on a monthly basis. A lactation consultant can teach you how to hand express breast milk, if you prefer not to use a pump.  CARING FOR YOUR BREASTS WHILE YOU BREASTFEED Nipples can become dry, cracked, and sore while breastfeeding. The following recommendations can help keep your breasts moisturized and healthy:  Avoid using soap on your nipples.   Wear a supportive bra. Although not required, special nursing bras and tank tops are designed to allow access to your breasts for breastfeeding without taking off your entire bra or top. Avoid wearing underwire-style bras or extremely tight bras.  Air dry your nipples for 3-69minutes after each feeding.   Use only cotton bra pads to absorb leaked breast milk. Leaking of breast milk between feedings is normal.   Use lanolin on your nipples after breastfeeding. Lanolin helps to maintain your skin's normal moisture barrier. If you use pure lanolin, you do not need to wash it off before feeding your baby again. Pure lanolin is not toxic to your baby. You may also hand express a few drops of breast milk and gently massage that milk into your nipples and allow the milk to  air dry. In the first few weeks after giving birth, some women experience extremely full breasts (engorgement). Engorgement can make your breasts feel heavy, warm, and tender to the touch. Engorgement peaks within 3-5 days after you give birth. The following recommendations can help ease engorgement:  Completely empty your breasts while breastfeeding or pumping. You may want to start by applying warm, moist heat (in the shower or with warm water-soaked hand towels) just before feeding or pumping. This increases circulation and helps the milk flow. If your baby does not completely empty your breasts while breastfeeding, pump any extra milk after he or she is finished.  Wear a snug bra (nursing or regular) or tank top for 1-2 days to signal your body to slightly decrease milk production.  Apply ice packs to your breasts, unless this is too uncomfortable for you.  Make sure that your baby is latched on and positioned properly while breastfeeding. If engorgement persists after 48 hours of following these recommendations, contact your health care provider or a Science writer. OVERALL HEALTH CARE RECOMMENDATIONS WHILE BREASTFEEDING  Eat healthy foods. Alternate between meals and snacks, eating 3 of each per day. Because what you eat affects your breast milk, some of the foods may make your baby more irritable than usual. Avoid eating these foods if you are sure that they are negatively affecting your baby.  Drink milk, fruit juice, and water to satisfy your thirst (about 10 glasses a day).   Rest often, relax, and continue to take your prenatal vitamins to prevent fatigue, stress, and anemia.  Continue breast self-awareness checks.  Avoid chewing and smoking tobacco.  Avoid alcohol and drug use. Some medicines that may be harmful to your baby can pass through breast milk. It is important to ask your health care provider before taking any medicine, including all over-the-counter and  prescription medicine as well as vitamin and herbal supplements. It is possible to become pregnant while breastfeeding. If birth control is desired, ask your health care provider about options that will be safe for your baby. SEEK MEDICAL CARE IF:   You feel like you want to stop breastfeeding or have become frustrated with breastfeeding.  You have painful breasts or nipples.  Your nipples are cracked or bleeding.  Your breasts are red, tender, or warm.  You have a swollen area on either breast.  You have a fever or chills.  You have nausea or vomiting.  You have drainage other than breast milk from your nipples.  Your breasts do not become full before feedings by the fifth day after you give birth.  You feel sad and depressed.  Your baby is too sleepy to eat well.  Your baby is having trouble sleeping.   Your baby is wetting less than 3 diapers in a 24-hour period.  Your baby has less than 3 stools in a 24-hour period.  Your baby's skin or the white part of his or her eyes becomes yellow.   Your baby is not gaining weight by 40 days of age. SEEK IMMEDIATE MEDICAL CARE IF:   Your baby is overly tired (lethargic) and does not want to wake up and feed.  Your baby develops an unexplained fever. Document Released: 08/03/2005 Document Revised: 08/08/2013 Document Reviewed: 01/25/2013 [facility] Patient Information 2015 [place], [place]. This information is not intended to replace advice given to you by your health care provider. Make sure you discuss any questions you have with your health care provider.

## 2014-06-18 ENCOUNTER — Encounter (HOSPITAL_COMMUNITY): Payer: Self-pay

## 2019-01-25 DIAGNOSIS — L700 Acne vulgaris: Secondary | ICD-10-CM | POA: Diagnosis not present

## 2019-01-25 DIAGNOSIS — B353 Tinea pedis: Secondary | ICD-10-CM | POA: Diagnosis not present

## 2019-02-04 DIAGNOSIS — Z1231 Encounter for screening mammogram for malignant neoplasm of breast: Secondary | ICD-10-CM | POA: Diagnosis not present

## 2019-06-22 DIAGNOSIS — H31092 Other chorioretinal scars, left eye: Secondary | ICD-10-CM | POA: Diagnosis not present

## 2019-06-22 DIAGNOSIS — H33322 Round hole, left eye: Secondary | ICD-10-CM | POA: Diagnosis not present

## 2019-06-22 DIAGNOSIS — H35413 Lattice degeneration of retina, bilateral: Secondary | ICD-10-CM | POA: Diagnosis not present

## 2019-06-22 DIAGNOSIS — H43812 Vitreous degeneration, left eye: Secondary | ICD-10-CM | POA: Diagnosis not present

## 2019-11-16 ENCOUNTER — Ambulatory Visit: Payer: Self-pay

## 2019-11-23 ENCOUNTER — Ambulatory Visit: Payer: Self-pay | Attending: Family

## 2019-11-23 DIAGNOSIS — Z23 Encounter for immunization: Secondary | ICD-10-CM

## 2019-11-23 NOTE — Progress Notes (Signed)
   Covid-19 Vaccination Clinic  Name:  SUKAINA NESHEIM    MRN: [id number] DOB: Aug 16, 1976  11/23/2019  Ms. Dibona was observed post Covid-19 immunization for 15 minutes without incident. She was provided with Vaccine Information Sheet and instruction to access the V-Safe system.   Ms. Doenges was instructed to call 911 with any severe reactions post vaccine: Marland Kitchen Difficulty breathing  . Swelling of face and throat  . A fast heartbeat  . A bad rash all over body  . Dizziness and weakness   Immunizations Administered    Name Date Dose VIS Date Route   Moderna COVID-19 Vaccine 11/23/2019  4:00 PM 0.5 mL 07/18/2019 Intramuscular   Manufacturer: Moderna   Lot: [id number]   [id number]

## 2019-12-26 ENCOUNTER — Ambulatory Visit: Payer: Self-pay | Attending: Family

## 2019-12-26 ENCOUNTER — Ambulatory Visit: Payer: Self-pay

## 2019-12-26 DIAGNOSIS — Z23 Encounter for immunization: Secondary | ICD-10-CM

## 2019-12-26 NOTE — Progress Notes (Signed)
   Covid-19 Vaccination Clinic  Name:  TNIA SZOSTEK    MRN: [id number] DOB: 1975-09-20  12/26/2019  [name] was observed post Covid-19 immunization for 15 minutes without incident. She was provided with Vaccine Information Sheet and instruction to access the V-Safe system.   [name] was instructed to call 911 with any severe reactions post vaccine: Marland Kitchen Difficulty breathing  . Swelling of face and throat  . A fast heartbeat  . A bad rash all over body  . Dizziness and weakness   Immunizations Administered    Name Date Dose VIS Date Route   Moderna COVID-19 Vaccine 12/26/2019  1:03 PM 0.5 mL 07/2019 Intramuscular   Manufacturer: Moderna   Lot: [id number]   [id number]

## 2020-08-23 DIAGNOSIS — M545 Low back pain, unspecified: Secondary | ICD-10-CM | POA: Diagnosis not present

## 2020-09-11 ENCOUNTER — Telehealth (INDEPENDENT_AMBULATORY_CARE_PROVIDER_SITE_OTHER): Payer: BC Managed Care – PPO | Admitting: Internal Medicine

## 2020-09-11 DIAGNOSIS — R5383 Other fatigue: Secondary | ICD-10-CM

## 2020-09-11 DIAGNOSIS — R748 Abnormal levels of other serum enzymes: Secondary | ICD-10-CM | POA: Diagnosis not present

## 2020-09-11 DIAGNOSIS — G8929 Other chronic pain: Secondary | ICD-10-CM

## 2020-09-11 DIAGNOSIS — M545 Low back pain, unspecified: Secondary | ICD-10-CM

## 2020-09-11 DIAGNOSIS — Z7689 Persons encountering health services in other specified circumstances: Secondary | ICD-10-CM | POA: Diagnosis not present

## 2020-09-11 NOTE — Progress Notes (Unsigned)
Virtual Visit via MyChart Video Note  I connected with [name], on 09/11/2020 at 2:26 PM by MyChart video due to the COVID-19 pandemic and verified that I am speaking with the correct person using two identifiers.   Consent: I discussed the limitations, risks, security and privacy concerns of performing an evaluation and management service by telephone and the availability of in person appointments. I also discussed with the patient that there may be a patient responsible charge related to this service. The patient expressed understanding and agreed to proceed.   Location of Patient: Environmental education officer of Provider: Clinic   Persons participating in Telemedicine visit: Jae Dire Dr. Juleen China      History of Present Illness: Patient has a visit to establish care. Has not been able to see a PCP for about 10 years---reports was pregnant or breastfeeding most of that time so saw OBGYN for most of this time.   Patient reports that she has had chronic low back pain for over 10 years. She has been seen by orthopedics in the past and pain was attributed to her postpartum. Patient very frustrated that her mother was sent to orthopedics for years and became very debilitated. Apparently she was finally seen by rheum and diagnosed with rheumatoid arthritis.   Patient very much wants to be seen by rheumatology and would like full work up for imaging and labs. Patient reports that pain seems to be related to hormones. Starts a few days prior to cycle, peaks with menses, and then seems to peak at time of ovulation as well. When pain is at worst, she has limited mobility and really struggles with walking or standing. Struggles with ADLs such as getting dressed, hygiene, toileting.   Also endorses chest wall pain, notices skin lumps along her scapula, has had months of foot pain. Has also had waves of fatigue.    Past Medical History:  Diagnosis Date  . Anemia    Phreesia  09/09/2020  . Fibroid    ?  Marland Kitchen PONV (postoperative nausea and vomiting)    No Known Allergies  Current Outpatient Medications on File Prior to Visit  Medication Sig Dispense Refill  . ferrous sulfate 325 (65 FE) MG tablet Take 1 tablet (325 mg total) by mouth 2 (two) times daily with a meal. 60 tablet 2  . ibuprofen (ADVIL,MOTRIN) 600 MG tablet Take 1 tablet (600 mg total) by mouth every 6 (six) hours. 30 tablet 0  . oxyCODONE-acetaminophen (PERCOCET/ROXICET) 5-325 MG per tablet Take 1-2 tablets by mouth every 4 (four) hours as needed for severe pain (moderate - severe pain). 30 tablet 0  . Prenatal Vit-Fe Fumarate-FA (PRENATAL MULTIVITAMIN) TABS tablet Take 1 tablet by mouth daily at 12 noon.     No current facility-administered medications on file prior to visit.    Observations/Objective: NAD. Speaking clearly.  Work of breathing normal.  Alert and oriented. Mood appropriate.   Assessment and Plan: 1. Encounter to establish care Reviewed patient's PMH, social history, surgical history, and medications.  Is overdue for annual exam, screening blood work, and health maintenance topics. Have asked patient to return for visit to address these items.   2. Chronic bilateral low back pain without sciatica Patient has significant concerns that her low back pain is related to an autoimmune disease given persistent course of pain at young age without improvement with weight bearing exercise, NSAIDs, muscle relaxants, heating pads, muscle rubs, etc. Will start rheum work up with labs  and imaging. Patient has been desiring to establish with rheum for several years---referral placed.  - Ambulatory referral to Rheumatology - HLA-B27 Antigen; Future - Sedimentation Rate; Future - C-reactive protein; Future - Rheumatoid factor; Future - DG Lumbar Spine Complete; Future - ANA,IFA RA Diag Pnl w/rflx Tit/Patn - CYCLIC CITRUL PEPTIDE ANTIBODY, IGG/IGA  3. Low serum high density lipoprotein  (HDL) Patient reports total cholesterol and LDL have been within normal limits but she has always had low HDL. This concerns her as she exercises and eats healthy. Will monitor.  - Lipid panel; Future  4. Fatigue, unspecified type Patient also endorsing fatigue with her pain. Will investigate abnormalities that could contribute to fatigue. If all normal, would consider working up for fibromyalgia.  - TSH; Future - Basic metabolic panel; Future - Vitamin B12; Future - VITAMIN D 25 Hydroxy (Vit-D Deficiency, Fractures); Future - CBC; Future - ANA,IFA RA Diag Pnl w/rflx Tit/Patn   Follow Up Instructions: Lab and imaging visit 1/27    I discussed the assessment and treatment plan with the patient. The patient was provided an opportunity to ask questions and all were answered. The patient agreed with the plan and demonstrated an understanding of the instructions.   The patient was advised to call back or seek an in-person evaluation if the symptoms worsen or if the condition fails to improve as anticipated.     I provided 35 minutes total of non-face-to-face time during this encounter including median intraservice time, reviewing previous notes, investigations, ordering medications, medical decision making, coordinating care and patient verbalized understanding at the end of the visit.    Phill Myron, D.O. Primary Care at [facility]  09/11/2020, 2:26 PM

## 2020-09-12 ENCOUNTER — Other Ambulatory Visit: Payer: Self-pay

## 2020-09-12 ENCOUNTER — Other Ambulatory Visit: Payer: BC Managed Care – PPO

## 2020-09-12 ENCOUNTER — Ambulatory Visit (INDEPENDENT_AMBULATORY_CARE_PROVIDER_SITE_OTHER): Payer: BC Managed Care – PPO

## 2020-09-12 DIAGNOSIS — G8929 Other chronic pain: Secondary | ICD-10-CM

## 2020-09-12 DIAGNOSIS — M545 Low back pain, unspecified: Secondary | ICD-10-CM | POA: Diagnosis not present

## 2020-09-12 DIAGNOSIS — R748 Abnormal levels of other serum enzymes: Secondary | ICD-10-CM

## 2020-09-12 DIAGNOSIS — R5383 Other fatigue: Secondary | ICD-10-CM

## 2020-09-13 LAB — SEDIMENTATION RATE: Sed Rate: 20 mm/hr (ref 0–32)

## 2020-09-20 LAB — BASIC METABOLIC PANEL
BUN/Creatinine Ratio: 16 (ref 9–23)
BUN: 13 mg/dL (ref 6–24)
CO2: 24 mmol/L (ref 20–29)
Calcium: 9.2 mg/dL (ref 8.7–10.2)
Chloride: 102 mmol/L (ref 96–106)
Creatinine, Ser: 0.83 mg/dL (ref 0.57–1.00)
GFR calc Af Amer: 98 mL/min/{1.73_m2} (ref >59)
GFR calc non Af Amer: 85 mL/min/{1.73_m2} (ref >59)
Glucose: 92 mg/dL (ref 65–99)
Potassium: 4.1 mmol/L (ref 3.5–5.2)
Sodium: 139 mmol/L (ref 134–144)

## 2020-09-20 LAB — LIPID PANEL
Chol/HDL Ratio: 4 ratio (ref 0.0–4.4)
Cholesterol, Total: 182 mg/dL (ref 100–199)
HDL: 46 mg/dL (ref >39)
LDL Chol Calc (NIH): 118 mg/dL — ABNORMAL HIGH (ref 0–99)
Triglycerides: 100 mg/dL (ref 0–149)
VLDL Cholesterol Cal: 18 mg/dL (ref 5–40)

## 2020-09-20 LAB — CBC
Hematocrit: 40.5 % (ref 34.0–46.6)
Hemoglobin: 12.5 g/dL (ref 11.1–15.9)
MCH: 23.6 pg — ABNORMAL LOW (ref 26.6–33.0)
MCHC: 30.9 g/dL — ABNORMAL LOW (ref 31.5–35.7)
MCV: 76 fL — ABNORMAL LOW (ref 79–97)
Platelets: 291 10*3/uL (ref 150–450)
RBC: 5.3 x10E6/uL — ABNORMAL HIGH (ref 3.77–5.28)
RDW: 16.3 % — ABNORMAL HIGH (ref 11.7–15.4)
WBC: 7.4 10*3/uL (ref 3.4–10.8)

## 2020-09-20 LAB — VITAMIN D 25 HYDROXY (VIT D DEFICIENCY, FRACTURES): Vit D, 25-Hydroxy: 19.2 ng/mL — ABNORMAL LOW (ref 30.0–100.0)

## 2020-09-20 LAB — HLA-B27 ANTIGEN: HLA B27: NEGATIVE

## 2020-09-20 LAB — C-REACTIVE PROTEIN: CRP: 7 mg/L (ref 0–10)

## 2020-09-20 LAB — TSH: TSH: 0.915 u[IU]/mL (ref 0.450–4.500)

## 2020-09-20 LAB — RHEUMATOID FACTOR: Rhuematoid fact SerPl-aCnc: <10 IU/mL (ref <14.0)

## 2020-09-20 LAB — VITAMIN B12: Vitamin B-12: 348 pg/mL (ref 232–1245)

## 2020-10-01 ENCOUNTER — Other Ambulatory Visit: Payer: Self-pay | Admitting: Internal Medicine

## 2020-10-01 DIAGNOSIS — E559 Vitamin D deficiency, unspecified: Secondary | ICD-10-CM | POA: Insufficient documentation

## 2020-10-01 MED ORDER — VITAMIN D (ERGOCALCIFEROL) 1.25 MG (50000 UNIT) PO CAPS: 50000.0000 [IU] | ORAL_CAPSULE | ORAL | 0 refills | Status: AC

## 2020-10-04 ENCOUNTER — Encounter: Payer: Self-pay | Admitting: Rheumatology

## 2020-10-04 ENCOUNTER — Other Ambulatory Visit: Payer: Self-pay

## 2020-10-04 ENCOUNTER — Ambulatory Visit (INDEPENDENT_AMBULATORY_CARE_PROVIDER_SITE_OTHER): Payer: BC Managed Care – PPO | Admitting: Rheumatology

## 2020-10-04 VITALS — BP 121/80 | HR 72 | Resp 16 | Ht 66.0 in | Wt 215.0 lb

## 2020-10-04 DIAGNOSIS — E559 Vitamin D deficiency, unspecified: Secondary | ICD-10-CM | POA: Diagnosis not present

## 2020-10-04 DIAGNOSIS — M5442 Lumbago with sciatica, left side: Secondary | ICD-10-CM

## 2020-10-04 DIAGNOSIS — Z862 Personal history of diseases of the blood and blood-forming organs and certain disorders involving the immune mechanism: Secondary | ICD-10-CM | POA: Diagnosis not present

## 2020-10-04 DIAGNOSIS — R5383 Other fatigue: Secondary | ICD-10-CM

## 2020-10-04 DIAGNOSIS — M5441 Lumbago with sciatica, right side: Secondary | ICD-10-CM | POA: Diagnosis not present

## 2020-10-04 DIAGNOSIS — G8929 Other chronic pain: Secondary | ICD-10-CM

## 2020-10-04 DIAGNOSIS — M5136 Other intervertebral disc degeneration, lumbar region: Secondary | ICD-10-CM | POA: Diagnosis not present

## 2020-10-04 NOTE — Patient Instructions (Signed)

## 2020-10-04 NOTE — Progress Notes (Signed)
Office Visit Note  Patient: [name]             Date of Birth: 1976/07/17           MRN: [id number]             PCP: Nicolette Bang, DO Referring: Caryl Never* Visit Date: 10/04/2020 Occupation: @GUAROCC @  Subjective:  Lower back pain.   History of Present Illness: [name] is a 45 y.o. female seen in consultation per request of her PCP.  According the patient she used to be very active, used to run and hike.  In 2008 she became pregnant and 3 months postpartum she started running again and had severe pain in her knee joints.  She switched to elliptical due to knee joint discomfort.  She states 7 months later she started having pain all over her body and even developed de Quervain's tenosynovitis.  In 2000 and she got pregnant again and all her symptoms went away.  Again in the postpartum period  her discomfort came back mostly in her lower back.  She states the lower back pain was severe to the point she could not even walk the next day.  She collapsed and went to the urgent care where she had x-rays which were normal.  She was diagnosed with anemia.  She states she has heavy periods and severe menstrual cramps.  In 2015 she got pregnant with her third child and started having lower back pain during pregnancy.  She states she also had severe anemia.  For the lower back pain she was referred to physical therapy which did not help at all.  She had difficulty walking.  She was also seen by her GYN due to severe menstrual cramps.  She was advised anti-inflammatories.  She states after COVID-19 she started homeschooling her children she also has a special needs child.  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  She is also going to school for social work.  She states she has flulike symptoms and increased pain in her lower back.  Sometimes she feels tired episodically.  She states she had an episode when she had a  rash on her foot which resolved and she had a rash on her left hand which resolved.  Currently she does not have any rash.  Her lower back pain persistent and severe.  She is frustrated that it took so long for anybody to do x-rays on her back.  I reviewed the x-rays with her which showed mild degenerative changes in the L4-L5 region.  Patient states recently the lower back pain has been severe and the pain radiates into her bilateral lower extremities.  She states her neck is also stiff at times.  She recently had all autoimmune labs by her PCP which were negative.  Activities of Daily Living:  Patient reports morning stiffness for 45-60 minutes.   Patient Reports nocturnal pain.  Difficulty dressing/grooming: Denies Difficulty climbing stairs: Denies Difficulty getting out of chair: Reports Difficulty using hands for taps, buttons, cutlery, and/or writing: Denies  Review of Systems  Constitutional: Positive for fatigue. Negative for night sweats, weight gain and weight loss.  HENT: Positive for nosebleeds. Negative for mouth sores, trouble swallowing, trouble swallowing, mouth dryness and nose dryness.   Eyes: Positive for itching. Negative for pain, redness, visual disturbance and dryness.  Respiratory: Negative for cough, shortness of breath and difficulty breathing.  Cardiovascular: Negative for chest pain, palpitations, hypertension, irregular heartbeat and swelling in legs/feet.  Gastrointestinal: Negative for blood in stool, constipation and diarrhea.  Endocrine: Negative for increased urination.  Genitourinary: Negative for difficulty urinating and vaginal dryness.  Musculoskeletal: Positive for arthralgias, joint pain, myalgias, morning stiffness and myalgias. Negative for joint swelling, muscle weakness and muscle tenderness.  Skin: Negative for color change, rash, hair loss, redness, skin tightness, ulcers and sensitivity to sunlight.  Allergic/Immunologic: Negative for susceptible  to infections.  Neurological: Positive for dizziness. Negative for numbness, headaches, memory loss, night sweats and weakness.  Hematological: Positive for bruising/bleeding tendency. Negative for swollen glands.  Psychiatric/Behavioral: Positive for sleep disturbance. Negative for depressed mood and confusion. The patient is not nervous/anxious.     PMFS History:  Patient Active Problem List   Diagnosis Date Noted  . Vitamin D deficiency 10/01/2020  . Anemia 03/12/2014  . Uterine fibroid 03/10/2014  . H/O: cesarean section 03/10/2014    Past Medical History:  Diagnosis Date  . Anemia    Phreesia 09/09/2020  . Fibroid    ?  Marland Kitchen PONV (postoperative nausea and vomiting)     Family History  Problem Relation Age of Onset  . Hypertension Paternal Grandmother   . Cancer Paternal Grandmother   . Cancer Father   . Heart disease Father   . Diabetes Father   . Other Mother        BLOOD TRANSFUSION FOLLOWING HIP REPLACEMENT  . Scoliosis Mother   . Fibromyalgia Mother   . Osteoarthritis Mother        DEGENERATIVE BONE DISEASE  . Rheum arthritis Mother   . Obesity Sister   . Obesity Sister   . Eating disorder Sister   . Endometriosis Sister   . Mental illness Sister   . Drug abuse Brother   . Alcohol abuse Brother   . Healthy Brother   . Healthy Daughter    Past Surgical History:  Procedure Laterality Date  . CESAREAN SECTION    . EYE SURGERY N/A    Phreesia 09/09/2020  . IRRIGATION AND DEBRIDEMENT SEBACEOUS CYST     on her back  . UTERINE FIBROID SURGERY     exploratory    Social History   Social History Narrative  . Not on file   Immunization History  Administered Date(s) Administered  . Moderna Sars-Covid-2 Vaccination 11/23/2019, 12/26/2019, 07/02/2020     Objective: Vital Signs: BP 121/80 (BP Location: Right Arm, Patient Position: Sitting, Cuff Size: Normal)   Pulse 72   Resp 16   Ht 5' 6"  (1.676 m)   Wt 215 lb (97.5 kg)   BMI 34.70 kg/m    Physical  Exam Vitals and nursing note reviewed.  Constitutional:      Appearance: She is well-developed and well-nourished.  HENT:     Head: Normocephalic and atraumatic.  Eyes:     Extraocular Movements: EOM normal.     Conjunctiva/sclera: Conjunctivae normal.  Cardiovascular:     Rate and Rhythm: Normal rate and regular rhythm.     Pulses: Intact distal pulses.     Heart sounds: Normal heart sounds.  Pulmonary:     Effort: Pulmonary effort is normal.     Breath sounds: Normal breath sounds.  Abdominal:     General: Bowel sounds are normal.     Palpations: Abdomen is soft.  Musculoskeletal:     Cervical back: Normal range of motion.  Lymphadenopathy:     Cervical: No cervical adenopathy.  Skin:  General: Skin is warm and dry.     Capillary Refill: Capillary refill takes less than 2 seconds.  Neurological:     Mental Status: She is alert and oriented to person, place, and time.  Psychiatric:        Mood and Affect: Mood and affect normal.        Behavior: Behavior normal.      Musculoskeletal Exam: C-spine thoracic and lumbar spine with good range of motion.  She had tenderness over the lower lumbar region.  Shoulder joints, elbow joints, wrist joints, MCPs PIPs and DIPs with good range of motion with no synovitis.  Hip joints, knee joints, ankles, MTPs and PIPs with good range of motion with no synovitis.  She had no muscular weakness or tenderness on my examination.  CDAI Exam: CDAI Score: -- Patient Global: --; Provider Global: -- Swollen: 0 ; Tender: 0  Joint Exam 10/04/2020   No joint exam has been documented for this visit   There is currently no information documented on the homunculus. Go to the Rheumatology activity and complete the homunculus joint exam.  Investigation: No additional findings.  Imaging: DG Lumbar Spine Complete  Result Date: 09/12/2020 CLINICAL DATA:  Chronic low back pain for 10 years, history of fibroids EXAM: LUMBAR SPINE - COMPLETE 4+ VIEW  COMPARISON:  09/13/2008 FINDINGS: Five non-rib-bearing lumbar vertebra. Mild disc space narrowing and adjacent vertebral sclerosis at L4-L5. Vertebral body and disc space heights otherwise maintained. No fracture, subluxation or bone destruction. No spondylolysis. SI joints preserved. LEFT pelvic phleboliths. Larger calcification RIGHT pelvis question calcifying uterine leiomyoma. IMPRESSION: Mild degenerative disc disease changes L4-L5. No acute osseous abnormalities. Electronically Signed   By: Lavonia Dana M.D.   On: 09/12/2020 09:07    Recent Labs: Lab Results  Component Value Date   WBC 7.4 09/12/2020   HGB 12.5 09/12/2020   PLT 291 09/12/2020   NA 139 09/12/2020   K 4.1 09/12/2020   CL 102 09/12/2020   CO2 24 09/12/2020   GLUCOSE 92 09/12/2020   BUN 13 09/12/2020   CREATININE 0.83 09/12/2020   CALCIUM 9.2 09/12/2020   GFRAA 98 09/12/2020   09/12/2020 LDL 118, vitamin D 19.2, B12 normal, TSH normal, RF negative, CRP normal, sed rate 20, HLA-B27 negative  Speciality Comments: No specialty comments available.  Procedures:  No procedures performed Allergies: Patient has no known allergies.   Assessment / Plan:     Visit Diagnoses: Chronic midline low back pain with bilateral sciatica-patient is has been suffering from lower back pain since 2010.  The pain has been gradually getting worse.  She has tried physical therapy without any results.  She also takes anti-inflammatories.  She is seen an orthopedic surgeon in the past.  She states she has had SI joint injections.  She has not noticed any improvement with any measures so far.  She is frustrated that her x-ray was done so many years later which showed some degenerative changes at L4-5.  X-ray findings were discussed with the patient.  She had good mobility in her spine.  She had no SI joint tenderness on my examination.  HLA-B27 is negative.  She had no synovitis on my examination.  She is frustrated that her lower back pain is so  severe that it interferes with her routine activities.  She has been experiencing pain radiating into her bilateral lower extremities recently.  I will schedule MRI of her lumbar spine to evaluate this further.  I will call  her with the MRI results when available.  At this point I do not think she has an autoimmune condition.  I have given her a handout on back exercises.  She looked quite dissatisfied about her evaluation and treatment over the years.  DDD lumbar spine-most recent x-ray showed L4-L5 degenerative changes.  History of anemia-she gives history of chronic anemia and heavy periods.  Vitamin D deficiency-she was recently diagnosed with vitamin D deficiency and she has been taking vitamin D.  This may be contributing to her fatigue.  Other fatigue-she gives history of episodic increased fatigue.  I discussed possibility of myofascial pain syndrome.  She states she did read about the fibromyalgia myofascial pain and it does not fit in her clinical picture.  Orders: Orders Placed This Encounter  Procedures  . MR LUMBAR SPINE WO CONTRAST   No orders of the defined types were placed in this encounter.   Face-to-face time spent with patient was 45  minutes. Greater than 50% of time was spent in counseling and coordination of care.  Follow-Up Instructions: Return if symptoms worsen or fail to improve, for Systemic lupus.   Bo Merino, MD  Note - This record has been created using Editor, commissioning.  Chart creation errors have been sought, but may not always  have been located. Such creation errors do not reflect on  the standard of medical care.

## 2020-10-11 ENCOUNTER — Ambulatory Visit (HOSPITAL_COMMUNITY)
Admission: RE | Admit: 2020-10-11 | Discharge: 2020-10-11 | Disposition: A | Discharge status: Home / Self Care | Payer: BC Managed Care – PPO | Source: Ambulatory Visit | Attending: Rheumatology | Admitting: Rheumatology

## 2020-10-11 DIAGNOSIS — M5136 Other intervertebral disc degeneration, lumbar region: Secondary | ICD-10-CM | POA: Insufficient documentation

## 2020-10-11 DIAGNOSIS — G8929 Other chronic pain: Secondary | ICD-10-CM | POA: Diagnosis not present

## 2020-10-11 DIAGNOSIS — M5441 Lumbago with sciatica, right side: Secondary | ICD-10-CM | POA: Diagnosis not present

## 2020-10-11 DIAGNOSIS — M5442 Lumbago with sciatica, left side: Secondary | ICD-10-CM | POA: Insufficient documentation

## 2020-10-11 DIAGNOSIS — M545 Low back pain, unspecified: Secondary | ICD-10-CM | POA: Diagnosis not present

## 2020-10-12 NOTE — Progress Notes (Signed)
Please notify patient that MRI of the lumbar spine showed mild degenerative changes.  I would recommend referral to pain management due to chronic lower back pain.  She does not have an autoimmune disease.

## 2020-10-14 NOTE — Progress Notes (Signed)
Patient has experienced pain for last 10 years.  She has been very upset that none of the physicians can give her a diagnosis.   I also discussed possibility of myofascial pain.  She also declined physical therapy.  There was nothing else to offer.  I did MRI to evaluate further.  She does not have a rheumatological disorder.  No follow-up appointment was given.  She left satisfied after the office visit and was appreciative of getting MRI.

## 2020-11-08 ENCOUNTER — Ambulatory Visit: Payer: BC Managed Care – PPO | Admitting: Rheumatology

## 2020-12-04 ENCOUNTER — Ambulatory Visit: Payer: BC Managed Care – PPO | Admitting: Rheumatology

## 2021-10-28 IMAGING — DX DG LUMBAR SPINE COMPLETE 4+V
5 series · 5 of 5 positions shown · non-contrast
Comparison: 09/13/2008

CLINICAL DATA: Chronic low back pain for 10 years, history of
fibroids

EXAM:
LUMBAR SPINE - COMPLETE 4+ VIEW

[lumbar spine ap]
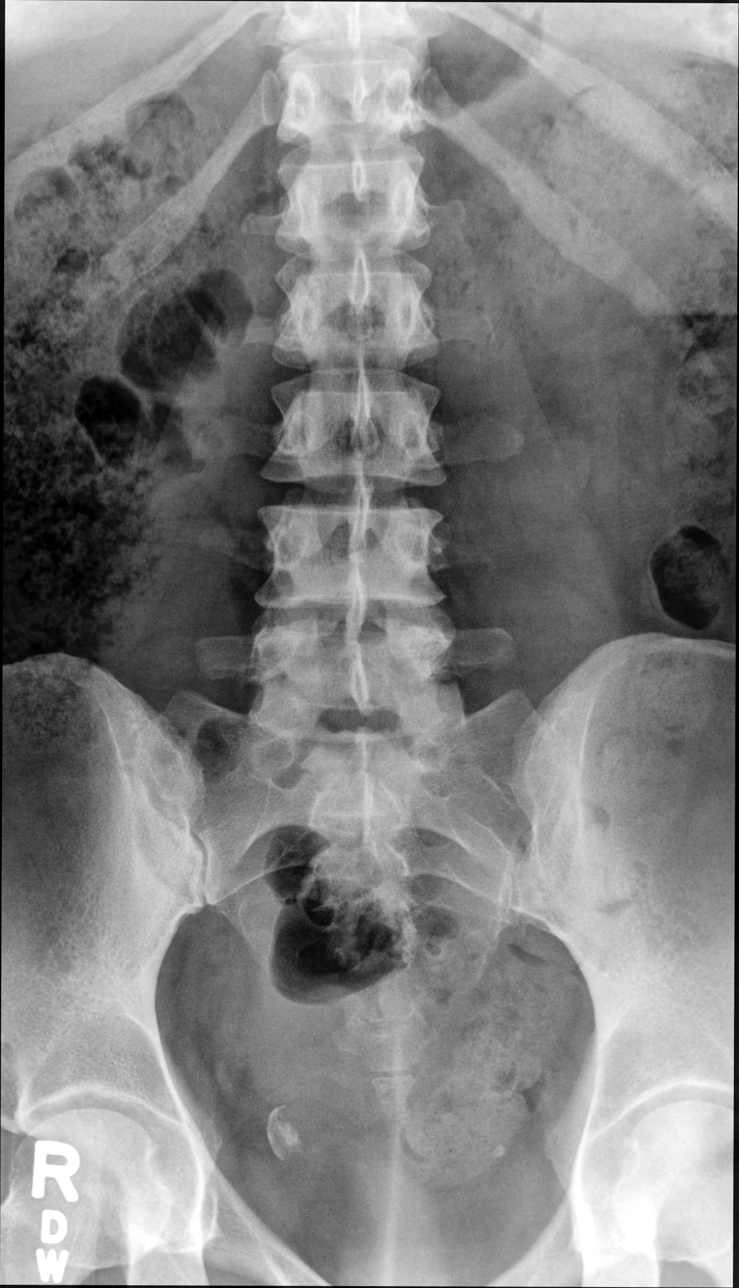

[lumbar spine oblique supine (1 of 2)]
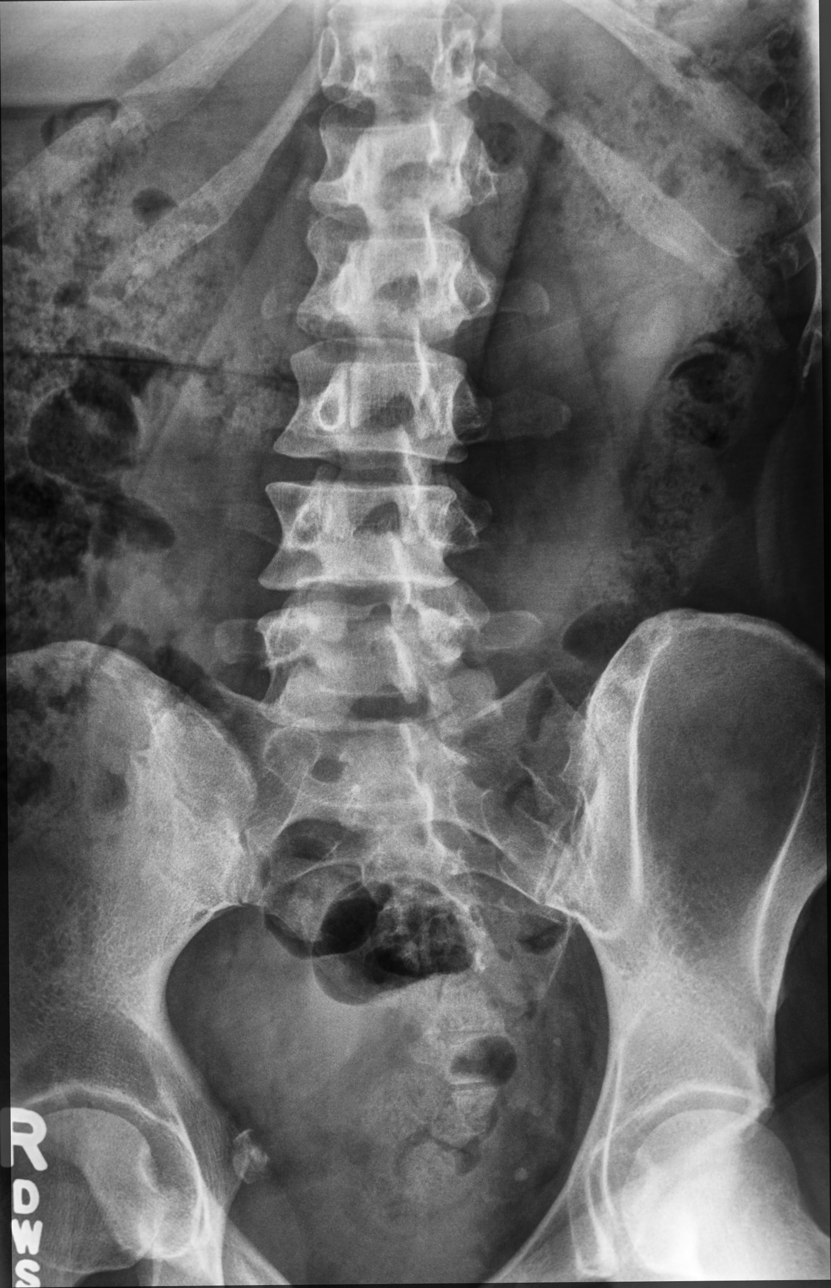

[lumbar spine oblique supine (2 of 2)]
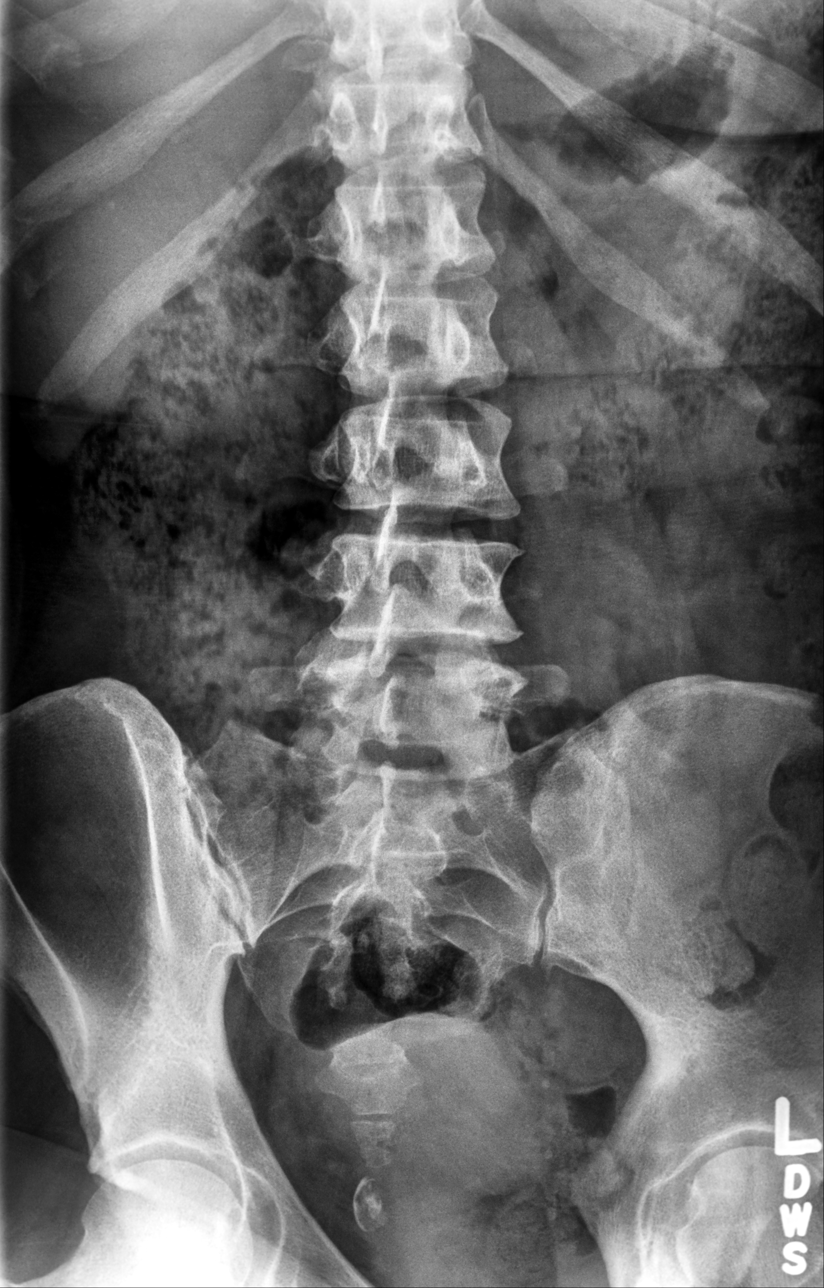

[lumbar spine lat (1 of 2)]
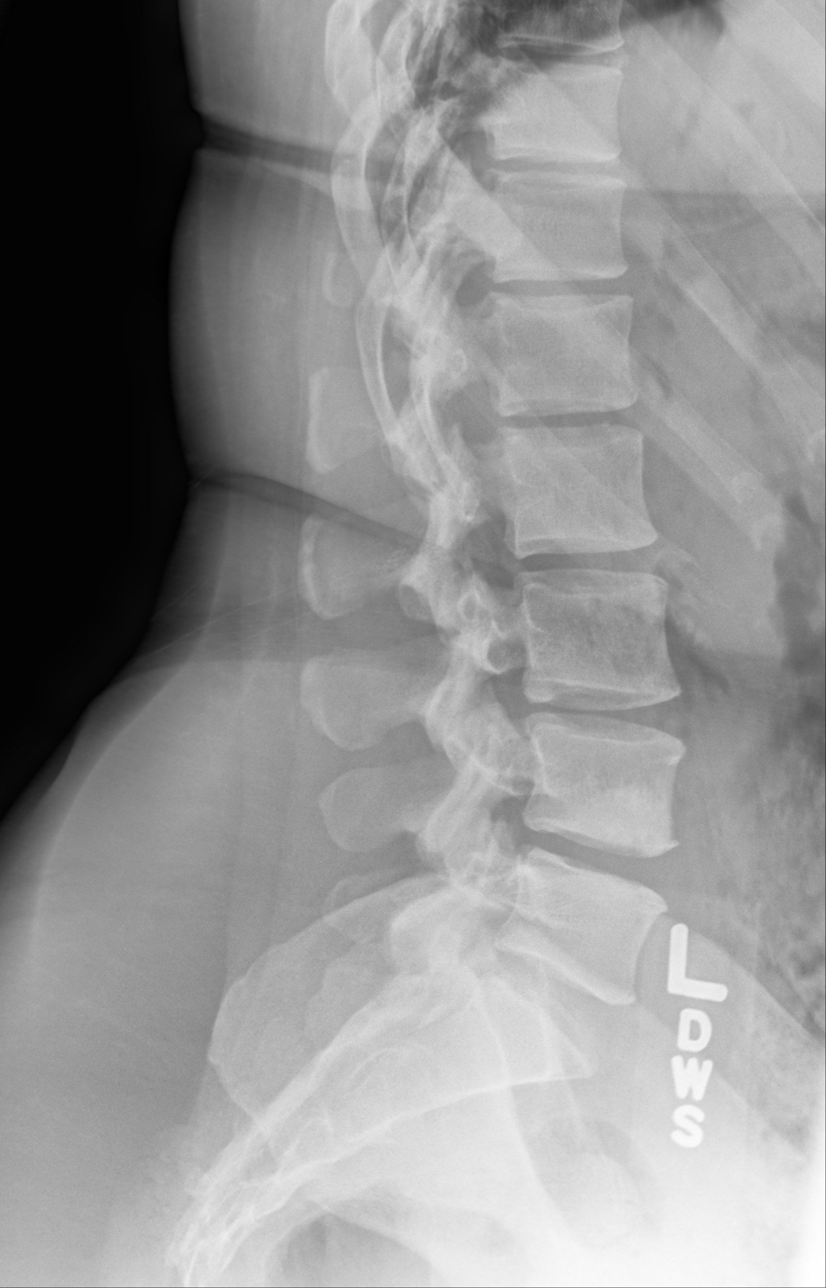

[lumbar spine lat (2 of 2)]
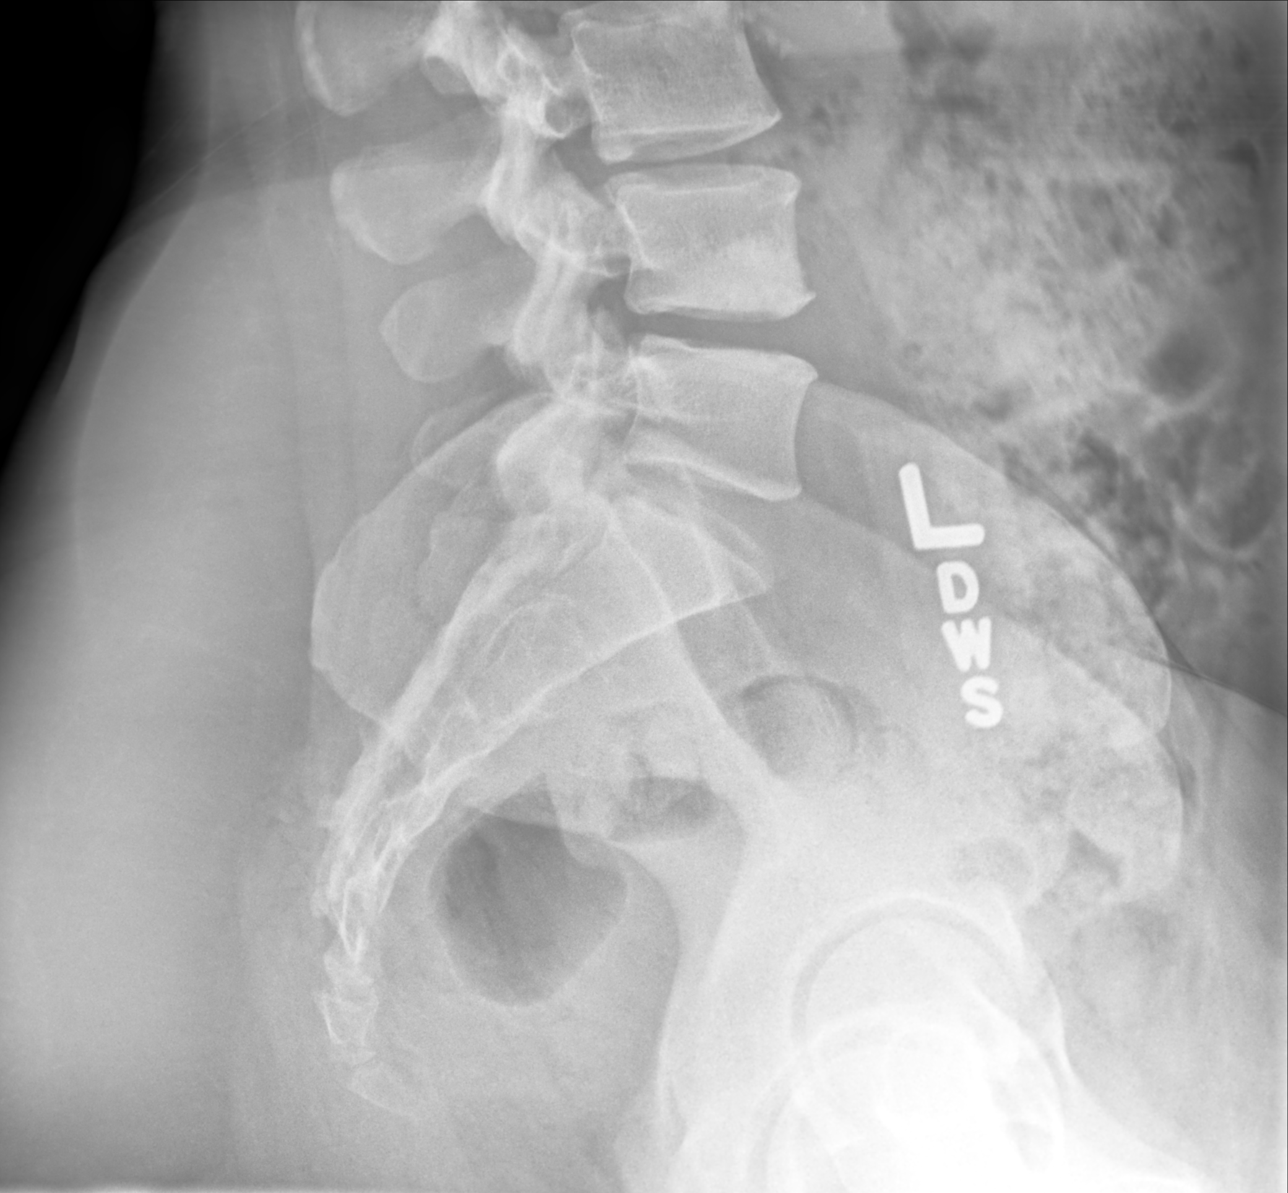

[5 of 5 positions shown; findings below may reference images not displayed]

FINDINGS: Five non-rib-bearing lumbar vertebra.

Mild disc space narrowing and adjacent vertebral sclerosis at L4-L5.

Vertebral body and disc space heights otherwise maintained.

No fracture, subluxation or bone destruction.

No spondylolysis.

SI joints preserved.

LEFT pelvic phleboliths.

Larger calcification RIGHT pelvis question calcifying uterine
leiomyoma.
IMPRESSION: Mild degenerative disc disease changes L4-L5.

No acute osseous abnormalities.
